# Patient Record
Sex: Female | Born: 1945 | Race: White | Hispanic: No | Marital: Married | Smoking: Former smoker
Health system: Southern US, Community
[De-identification: ages and names within clinical notes are randomized; demographics above are authoritative.]

## PROBLEM LIST (undated history)

## (undated) DIAGNOSIS — I10 Essential (primary) hypertension: Secondary | ICD-10-CM

## (undated) DIAGNOSIS — G629 Polyneuropathy, unspecified: Secondary | ICD-10-CM

## (undated) DIAGNOSIS — E785 Hyperlipidemia, unspecified: Secondary | ICD-10-CM

## (undated) DIAGNOSIS — M1990 Unspecified osteoarthritis, unspecified site: Secondary | ICD-10-CM

## (undated) DIAGNOSIS — T753XXA Motion sickness, initial encounter: Secondary | ICD-10-CM

## (undated) HISTORY — PX: TOTAL KNEE ARTHROPLASTY: SHX125

## (undated) HISTORY — PX: REPLACEMENT TOTAL KNEE BILATERAL: SUR1225

## (undated) HISTORY — PX: CHOLECYSTECTOMY: SHX55

## (undated) HISTORY — PX: ABDOMINAL HYSTERECTOMY: SHX81

---

## 1988-07-18 HISTORY — PX: BREAST EXCISIONAL BIOPSY: SUR124

## 2015-11-22 ENCOUNTER — Emergency Department (HOSPITAL_COMMUNITY): Payer: Medicare Other

## 2015-11-22 ENCOUNTER — Inpatient Hospital Stay (HOSPITAL_COMMUNITY)
Admission: EM | Admit: 2015-11-22 | Discharge: 2015-11-23 | DRG: 149 | Disposition: A | Discharge status: Home / Self Care | Payer: Medicare Other | Attending: Cardiovascular Disease | Admitting: Cardiovascular Disease

## 2015-11-22 ENCOUNTER — Inpatient Hospital Stay (HOSPITAL_COMMUNITY): Payer: Medicare Other

## 2015-11-22 ENCOUNTER — Encounter (HOSPITAL_COMMUNITY): Payer: Self-pay

## 2015-11-22 ENCOUNTER — Encounter (HOSPITAL_COMMUNITY)
Admission: EM | Disposition: A | Discharge status: Home / Self Care | Payer: Self-pay | Source: Home / Self Care | Attending: Cardiovascular Disease

## 2015-11-22 DIAGNOSIS — Z96653 Presence of artificial knee joint, bilateral: Secondary | ICD-10-CM | POA: Diagnosis present

## 2015-11-22 DIAGNOSIS — Z6839 Body mass index (BMI) 39.0-39.9, adult: Secondary | ICD-10-CM

## 2015-11-22 DIAGNOSIS — I10 Essential (primary) hypertension: Secondary | ICD-10-CM | POA: Diagnosis present

## 2015-11-22 DIAGNOSIS — R42 Dizziness and giddiness: Principal | ICD-10-CM | POA: Diagnosis present

## 2015-11-22 DIAGNOSIS — Z87891 Personal history of nicotine dependence: Secondary | ICD-10-CM

## 2015-11-22 DIAGNOSIS — R9431 Abnormal electrocardiogram [ECG] [EKG]: Secondary | ICD-10-CM | POA: Insufficient documentation

## 2015-11-22 DIAGNOSIS — E669 Obesity, unspecified: Secondary | ICD-10-CM | POA: Diagnosis present

## 2015-11-22 DIAGNOSIS — I2510 Atherosclerotic heart disease of native coronary artery without angina pectoris: Secondary | ICD-10-CM | POA: Diagnosis present

## 2015-11-22 DIAGNOSIS — E785 Hyperlipidemia, unspecified: Secondary | ICD-10-CM | POA: Diagnosis present

## 2015-11-22 HISTORY — DX: Essential (primary) hypertension: I10

## 2015-11-22 HISTORY — DX: Hyperlipidemia, unspecified: E78.5

## 2015-11-22 HISTORY — PX: CARDIAC CATHETERIZATION: SHX172

## 2015-11-22 HISTORY — DX: Polyneuropathy, unspecified: G62.9

## 2015-11-22 LAB — I-STAT CHEM 8, ED
BUN: 24 mg/dL — AB (ref 6–20)
CREATININE: 0.7 mg/dL (ref 0.44–1.00)
Calcium, Ion: 1.19 mmol/L (ref 1.13–1.30)
Chloride: 103 mmol/L (ref 101–111)
GLUCOSE: 124 mg/dL — AB (ref 65–99)
HCT: 47 % — ABNORMAL HIGH (ref 36.0–46.0)
Hemoglobin: 16 g/dL — ABNORMAL HIGH (ref 12.0–15.0)
Potassium: 4.1 mmol/L (ref 3.5–5.1)
SODIUM: 141 mmol/L (ref 135–145)
TCO2: 24 mmol/L (ref 0–100)

## 2015-11-22 LAB — CBC WITH DIFFERENTIAL/PLATELET
BASOS ABS: 0 10*3/uL (ref 0.0–0.1)
Basophils Relative: 0 %
EOS PCT: 5 %
Eosinophils Absolute: 0.3 10*3/uL (ref 0.0–0.7)
HEMATOCRIT: 44.4 % (ref 36.0–46.0)
Hemoglobin: 13.9 g/dL (ref 12.0–15.0)
LYMPHS ABS: 2.2 10*3/uL (ref 0.7–4.0)
LYMPHS PCT: 31 %
MCH: 29 pg (ref 26.0–34.0)
MCHC: 31.3 g/dL (ref 30.0–36.0)
MCV: 92.7 fL (ref 78.0–100.0)
MONO ABS: 0.6 10*3/uL (ref 0.1–1.0)
Monocytes Relative: 9 %
NEUTROS ABS: 4.1 10*3/uL (ref 1.7–7.7)
Neutrophils Relative %: 55 %
Platelets: 174 10*3/uL (ref 150–400)
RBC: 4.79 MIL/uL (ref 3.87–5.11)
RDW: 13.9 % (ref 11.5–15.5)
WBC: 7.3 10*3/uL (ref 4.0–10.5)

## 2015-11-22 LAB — COMPREHENSIVE METABOLIC PANEL
ALBUMIN: 3.7 g/dL (ref 3.5–5.0)
ALK PHOS: 100 U/L (ref 38–126)
ALT: 39 U/L (ref 14–54)
AST: 39 U/L (ref 15–41)
Anion gap: 15 (ref 5–15)
BILIRUBIN TOTAL: 0.6 mg/dL (ref 0.3–1.2)
BUN: 21 mg/dL — ABNORMAL HIGH (ref 6–20)
CALCIUM: 9.8 mg/dL (ref 8.9–10.3)
CO2: 22 mmol/L (ref 22–32)
Chloride: 102 mmol/L (ref 101–111)
Creatinine, Ser: 0.74 mg/dL (ref 0.44–1.00)
GFR calc Af Amer: >60 mL/min (ref >60)
GLUCOSE: 129 mg/dL — AB (ref 65–99)
Potassium: 4.2 mmol/L (ref 3.5–5.1)
Sodium: 139 mmol/L (ref 135–145)
TOTAL PROTEIN: 7.4 g/dL (ref 6.5–8.1)

## 2015-11-22 LAB — I-STAT TROPONIN, ED: Troponin i, poc: 0 ng/mL (ref 0.00–0.08)

## 2015-11-22 LAB — TROPONIN I: Troponin I: <0.03 ng/mL (ref <0.031)

## 2015-11-22 SURGERY — LEFT HEART CATH AND CORONARY ANGIOGRAPHY
Anesthesia: LOCAL

## 2015-11-22 MED ORDER — SODIUM CHLORIDE 0.9% FLUSH
3.0000 mL | Freq: Two times a day (BID) | INTRAVENOUS | Status: DC
  Administered 2015-11-22 – 2015-11-23 (×2): 3 mL via INTRAVENOUS

## 2015-11-22 MED ORDER — VERAPAMIL HCL 2.5 MG/ML IV SOLN
INTRAVENOUS | Status: AC
  Filled 2015-11-22: qty 2

## 2015-11-22 MED ORDER — FENTANYL CITRATE (PF) 100 MCG/2ML IJ SOLN
INTRAMUSCULAR | Status: AC
  Filled 2015-11-22: qty 2

## 2015-11-22 MED ORDER — LIDOCAINE HCL (PF) 1 % IJ SOLN
INTRAMUSCULAR | Status: AC
  Filled 2015-11-22: qty 30

## 2015-11-22 MED ORDER — SODIUM CHLORIDE 0.9% FLUSH: 3.0000 mL | INTRAVENOUS | Status: DC | PRN

## 2015-11-22 MED ORDER — HEPARIN (PORCINE) IN NACL 2-0.9 UNIT/ML-% IJ SOLN
INTRAMUSCULAR | Status: DC | PRN
  Administered 2015-11-22: 1500 mL

## 2015-11-22 MED ORDER — MIDAZOLAM HCL 2 MG/2ML IJ SOLN
INTRAMUSCULAR | Status: AC
  Filled 2015-11-22: qty 2

## 2015-11-22 MED ORDER — ONDANSETRON HCL 4 MG/2ML IJ SOLN: 4.0000 mg | Freq: Four times a day (QID) | INTRAMUSCULAR | Status: DC | PRN

## 2015-11-22 MED ORDER — HYDROCHLOROTHIAZIDE 12.5 MG PO CAPS
12.5000 mg | ORAL_CAPSULE | Freq: Every day | ORAL | Status: DC
Start: 2015-11-23 — End: 2015-11-23
  Administered 2015-11-23: 12.5 mg via ORAL
  Filled 2015-11-22 (×2): qty 1

## 2015-11-22 MED ORDER — SODIUM CHLORIDE 0.9 % IV SOLN
INTRAVENOUS | Status: AC
  Administered 2015-11-22: 14:00:00 via INTRAVENOUS

## 2015-11-22 MED ORDER — ASPIRIN 81 MG PO CHEW: 324.0000 mg | CHEWABLE_TABLET | Freq: Once | ORAL | Status: DC

## 2015-11-22 MED ORDER — LIDOCAINE HCL (PF) 1 % IJ SOLN
INTRAMUSCULAR | Status: DC | PRN
  Administered 2015-11-22: 2 mL

## 2015-11-22 MED ORDER — IOPAMIDOL (ISOVUE-370) INJECTION 76%
INTRAVENOUS | Status: DC | PRN
  Administered 2015-11-22: 155 mL via INTRA_ARTERIAL

## 2015-11-22 MED ORDER — ATENOLOL 25 MG PO TABS
100.0000 mg | ORAL_TABLET | Freq: Every day | ORAL | Status: DC
  Administered 2015-11-22: 100 mg via ORAL
  Filled 2015-11-22: qty 4

## 2015-11-22 MED ORDER — HEPARIN SODIUM (PORCINE) 5000 UNIT/ML IJ SOLN
INTRAMUSCULAR | Status: AC
  Administered 2015-11-22: 5000 [IU]
  Filled 2015-11-22: qty 1

## 2015-11-22 MED ORDER — SODIUM CHLORIDE 0.9 % IV SOLN: 250.0000 mL | INTRAVENOUS | Status: DC | PRN

## 2015-11-22 MED ORDER — LISINOPRIL 40 MG PO TABS
40.0000 mg | ORAL_TABLET | Freq: Every day | ORAL | Status: DC
  Administered 2015-11-23: 40 mg via ORAL
  Filled 2015-11-22: qty 1

## 2015-11-22 MED ORDER — IOPAMIDOL (ISOVUE-370) INJECTION 76%
INTRAVENOUS | Status: AC
  Filled 2015-11-22: qty 125

## 2015-11-22 MED ORDER — HEPARIN BOLUS VIA INFUSION: 4000.0000 [IU] | Freq: Once | INTRAVENOUS | Status: DC

## 2015-11-22 MED ORDER — FENTANYL CITRATE (PF) 100 MCG/2ML IJ SOLN
INTRAMUSCULAR | Status: DC | PRN
  Administered 2015-11-22: 25 ug via INTRAVENOUS

## 2015-11-22 MED ORDER — GABAPENTIN 600 MG PO TABS
300.0000 mg | ORAL_TABLET | Freq: Two times a day (BID) | ORAL | Status: DC
  Administered 2015-11-22 – 2015-11-23 (×3): 300 mg via ORAL
  Filled 2015-11-22 (×3): qty 1

## 2015-11-22 MED ORDER — ATENOLOL 25 MG PO TABS: 50.0000 mg | ORAL_TABLET | Freq: Every day | ORAL | Status: DC

## 2015-11-22 MED ORDER — HEPARIN SODIUM (PORCINE) 1000 UNIT/ML IJ SOLN
INTRAMUSCULAR | Status: AC
  Filled 2015-11-22: qty 1

## 2015-11-22 MED ORDER — ACETAMINOPHEN 325 MG PO TABS: 650.0000 mg | ORAL_TABLET | ORAL | Status: DC | PRN

## 2015-11-22 MED ORDER — NORTRIPTYLINE HCL 25 MG PO CAPS
50.0000 mg | ORAL_CAPSULE | Freq: Every day | ORAL | Status: DC
  Administered 2015-11-22: 50 mg via ORAL
  Filled 2015-11-22: qty 2

## 2015-11-22 MED ORDER — NITROGLYCERIN 1 MG/10 ML FOR IR/CATH LAB
INTRA_ARTERIAL | Status: AC
  Filled 2015-11-22: qty 10

## 2015-11-22 MED ORDER — NORTRIPTYLINE HCL 25 MG PO CAPS
25.0000 mg | ORAL_CAPSULE | Freq: Every day | ORAL | Status: DC
  Filled 2015-11-22: qty 1

## 2015-11-22 MED ORDER — HEPARIN SODIUM (PORCINE) 1000 UNIT/ML IJ SOLN
INTRAMUSCULAR | Status: DC | PRN
  Administered 2015-11-22: 4000 [IU] via INTRAVENOUS

## 2015-11-22 MED ORDER — MIDAZOLAM HCL 2 MG/2ML IJ SOLN
INTRAMUSCULAR | Status: DC | PRN
  Administered 2015-11-22: 2 mg via INTRAVENOUS

## 2015-11-22 SURGICAL SUPPLY — 11 items
CATH INFINITI 5FR ANG PIGTAIL (CATHETERS) ×2 IMPLANT
CATH INFINITI JR4 5F (CATHETERS) ×2 IMPLANT
DEVICE RAD COMP TR BAND LRG (VASCULAR PRODUCTS) ×2 IMPLANT
GLIDESHEATH SLEND SS 6F .021 (SHEATH) ×2 IMPLANT
KIT ENCORE 26 ADVANTAGE (KITS) ×2 IMPLANT
KIT HEART LEFT (KITS) ×2 IMPLANT
PACK CARDIAC CATHETERIZATION (CUSTOM PROCEDURE TRAY) ×2 IMPLANT
SYR MEDRAD MARK V 150ML (SYRINGE) ×2 IMPLANT
TRANSDUCER W/STOPCOCK (MISCELLANEOUS) ×2 IMPLANT
TUBING CIL FLEX 10 FLL-RA (TUBING) ×2 IMPLANT
WIRE SAFE-T 1.5MM-J .035X260CM (WIRE) ×2 IMPLANT

## 2015-11-22 NOTE — ED Provider Notes (Signed)
CSN: [id number]     Arrival date & time 11/22/15  1134 History   First MD Initiated Contact with Patient 11/22/15 1136     Chief Complaint  Patient presents with  . Dizziness  . Code STEMI     (Consider location/radiation/quality/duration/timing/severity/associated sxs/prior Treatment) HPI  70 year old female presents with acute dizziness. EMS activated a code STEMI based on ECG. Patient was sitting in Sunday school and all of a sudden became dizzy. Felt like the room was spinning and she felt like she would fall if she tried to stand up. This lasted about 10 minutes. Since then she has felt "off" and not quite right. However she denies chest pain or short of breath. No headaches, blurry vision, vomiting, or weakness/numbness. ECG from EMS shows inferior ST elevations as well as in V4 through 6. No old ECG. History of hypertension, hyperlipidemia.  No past medical history on file. No past surgical history on file. No family history on file. Social History  Substance Use Topics  . Smoking status: Not on file  . Smokeless tobacco: Not on file  . Alcohol Use: Not on file   OB History    No data available     Review of Systems  Respiratory: Negative for shortness of breath.   Cardiovascular: Negative for chest pain.  Gastrointestinal: Negative for vomiting.  Neurological: Positive for dizziness. Negative for syncope, weakness and numbness.  All other systems reviewed and are negative.     Allergies  Review of patient's allergies indicates not on file.  Home Medications   Prior to Admission medications   Not on File   There were no vitals taken for this visit. Physical Exam  Constitutional: She is oriented to person, place, and time. She appears well-developed and well-nourished.  HENT:  Head: Normocephalic and atraumatic.  Right Ear: External ear normal.  Left Ear: External ear normal.  Nose: Nose normal.  Eyes: EOM are normal. Pupils are equal, round, and reactive to  light. Right eye exhibits no discharge. Left eye exhibits no discharge.  Neck: Neck supple.  Cardiovascular: Normal rate, regular rhythm and normal heart sounds.   Pulses:      Radial pulses are 2+ on the right side, and 2+ on the left side.  Pulmonary/Chest: Effort normal and breath sounds normal.  Abdominal: Soft. There is no tenderness.  Neurological: She is alert and oriented to person, place, and time.  5/5 strength in all 4 extremities  Skin: Skin is warm and dry.  Nursing note and vitals reviewed.   ED Course  Procedures (including critical care time) Labs Review Labs Reviewed  COMPREHENSIVE METABOLIC PANEL - Abnormal; Notable for the following:    Glucose, Bld 129 (*)    BUN 21 (*)    All other components within normal limits  I-STAT CHEM 8, ED - Abnormal; Notable for the following:    BUN 24 (*)    Glucose, Bld 124 (*)    Hemoglobin 16.0 (*)    HCT 47.0 (*)    All other components within normal limits  CBC WITH DIFFERENTIAL/PLATELET  TROPONIN I  I-STAT TROPOININ, ED    Imaging Review Dg Chest Portable 1 View  11/22/2015  CLINICAL DATA:  Acute ST-elevation and dizziness today. EXAM: PORTABLE CHEST 1 VIEW COMPARISON:  None. FINDINGS: The cardiomediastinal silhouette is unremarkable. Elevation the right hemidiaphragm is likely chronic. There is no evidence of focal airspace disease, pulmonary edema, suspicious pulmonary nodule/mass, pleural effusion, or pneumothorax. No acute bony abnormalities are  identified. IMPRESSION: No evidence of acute cardiopulmonary disease. Right hemidiaphragm elevation -likely chronic. Electronically Signed   By: Harmon PierJeffrey  Hu M.D.   On: 11/22/2015 14:42   I have personally reviewed and evaluated these images and lab results as part of my medical decision-making.   EKG Interpretation   Date/Time:  Sunday Nov 22 2015 11:39:59 EDT Ventricular Rate:  61 PR Interval:  146 QRS Duration: 86 QT Interval:  414 QTC Calculation: 416 R Axis:    79 Text Interpretation:  Normal sinus rhythm ST elevation, early repol vs  ischemia No old tracing to compare Confirmed by Jamaria Amborn MD, Greysin Medlen [phone])  on 11/22/2015 11:48:27 AM      MDM   Final diagnoses:  Abnormal EKG  Dizziness    Patient appears well here and has no acute chest pain and has not had chest pain at all today. No dyspnea either. Dizziness was probably not cardiac but her ECG does show ST elevation. Given her age and risk factors, cardiology has decided to take to cath lab. Delay in cath lab due to current patients getting cath and no available spot. She was given aspirin by EMS and will be given a heparin bolus. Neuro exam is unremarkable now on a thinks that acute stroke is unlikely. Admit to cardiology.    Pricilla LovelessScott Amiliah Campisi, MD 11/22/15 [phone]

## 2015-11-22 NOTE — H&P (Signed)
     Patient ID: Barbara Hoover MRN: [id number] DOB/AGE: 68/02/1946 68 y.o. Admit date: 11/22/2015  Primary Cardiologist: New  HPI: 70 yo female with history of HTN, HLD, obesity who [facility] with c/o dizziness. EMS noted ST elevation in the inferior leads and activated a Code STEMI. Pt had resolution of dizziness but persistent ST elevation in the inferior and anterolateral leads. No chest pain or dyspnea. No prior cardiac disease. She is a non-smoker.    Review of systems complete and found to be negative unless listed above   Past Medical History  Diagnosis Date  . Hypertension   . Neuropathy (HCC)   . Hyperlipidemia     Family History  Problem Relation Age of Onset  . CAD Mother     Social History   Social History  . Marital Status: N/A    Spouse Name: N/A  . Number of Children: N/A  . Years of Education: N/A   Occupational History  . Not on file.   Social History Main Topics  . Smoking status: Former Smoker -- 1.00 packs/day for 4 years    Types: Cigarettes    Quit date: 07/18/1985  . Smokeless tobacco: Former NeurosurgeonUser    Quit date: 07/24/1968  . Alcohol Use: 0.0 oz/week    0 Standard drinks or equivalent per week     Comment: Occasional  . Drug Use: Not on file  . Sexual Activity: Not on file   Other Topics Concern  . Not on file   Social History Narrative  . No narrative on file    Past Surgical History  Procedure Laterality Date  . Total knee arthroplasty Bilateral   . Cholecystectomy      Allergies  Allergen Reactions  . Sulfa Antibiotics Itching    Prior to Admission Meds:  Prior to Admission medications   Not on File    Physical Exam: Blood pressure 175/74, pulse 62, resp. rate 14, SpO2 97 %.    General: Well developed, well nourished, NAD  HEENT: OP clear, mucus membranes moist  SKIN: warm, dry. No rashes.  Neuro: No focal deficits  Musculoskeletal: Muscle strength 5/5 all ext  Psychiatric: Mood and affect normal  Neck:  No JVD, no carotid bruits, no thyromegaly, no lymphadenopathy.  Lungs:Clear bilaterally, no wheezes, rhonci, crackles  Cardiovascular: Regular rate and rhythm. No murmurs, gallops or rubs.  Abdomen:Soft. Bowel sounds present. Non-tender.  Extremities: No lower extremity edema. Pulses are 2 + in the bilateral DP/PT.   Labs:   Lab Results  Component Value Date   HGB 16.0* 11/22/2015   HCT 47.0* 11/22/2015     Recent Labs Lab 11/22/15 1142  NA 141  K 4.1  CL 103  BUN 24*  CREATININE 0.70  GLUCOSE 124*     EKG: sinus, 1 mm ST elevation leads II, III, avF, V5, V6.   ASSESSMENT AND PLAN:   1. ST elevation: Pt presented with dizziness and inferior ST elevation. Resolution of symptoms but persistent ST elevation. Plan emergent cardiac cath.   2. HTN: Will resume home BP meds.   Earney Hamburghris McAlhany, MD 11/22/2015, 11:53 AM

## 2015-11-22 NOTE — ED Notes (Signed)
[facility]- pt was in Sunday school and became dizzy. Pt reports dizziness has subsided but still does not feel well. Pt denies chest pain at any point.  Pt is alert and oriented, 324mg  of aspirin, 2 nitro, and 4mg  of zofran prior to arrival.

## 2015-11-23 ENCOUNTER — Inpatient Hospital Stay (HOSPITAL_COMMUNITY): Payer: Medicare Other

## 2015-11-23 ENCOUNTER — Encounter (HOSPITAL_COMMUNITY): Payer: Self-pay | Admitting: Cardiovascular Disease

## 2015-11-23 DIAGNOSIS — I10 Essential (primary) hypertension: Secondary | ICD-10-CM | POA: Diagnosis not present

## 2015-11-23 DIAGNOSIS — E785 Hyperlipidemia, unspecified: Secondary | ICD-10-CM | POA: Diagnosis not present

## 2015-11-23 DIAGNOSIS — R9431 Abnormal electrocardiogram [ECG] [EKG]: Secondary | ICD-10-CM | POA: Diagnosis not present

## 2015-11-23 DIAGNOSIS — R42 Dizziness and giddiness: Secondary | ICD-10-CM | POA: Diagnosis not present

## 2015-11-23 HISTORY — DX: Essential (primary) hypertension: I10

## 2015-11-23 LAB — BASIC METABOLIC PANEL
ANION GAP: 10 (ref 5–15)
BUN: 20 mg/dL (ref 6–20)
CHLORIDE: 106 mmol/L (ref 101–111)
CO2: 25 mmol/L (ref 22–32)
Calcium: 9.3 mg/dL (ref 8.9–10.3)
Creatinine, Ser: 0.75 mg/dL (ref 0.44–1.00)
GFR calc Af Amer: >60 mL/min (ref >60)
GLUCOSE: 110 mg/dL — AB (ref 65–99)
POTASSIUM: 3.8 mmol/L (ref 3.5–5.1)
Sodium: 141 mmol/L (ref 135–145)

## 2015-11-23 LAB — CBC
HEMATOCRIT: 43.1 % (ref 36.0–46.0)
HEMOGLOBIN: 13.4 g/dL (ref 12.0–15.0)
MCH: 28.9 pg (ref 26.0–34.0)
MCHC: 31.1 g/dL (ref 30.0–36.0)
MCV: 92.9 fL (ref 78.0–100.0)
PLATELETS: 203 10*3/uL (ref 150–400)
RBC: 4.64 MIL/uL (ref 3.87–5.11)
RDW: 14.1 % (ref 11.5–15.5)
WBC: 8 10*3/uL (ref 4.0–10.5)

## 2015-11-23 LAB — ECHOCARDIOGRAM COMPLETE

## 2015-11-23 MED ORDER — PERFLUTREN LIPID MICROSPHERE
1.0000 mL | INTRAVENOUS | Status: AC | PRN
  Administered 2015-11-23: 2 mL via INTRAVENOUS
  Filled 2015-11-23: qty 10

## 2015-11-23 MED FILL — Nitroglycerin IV Soln 100 MCG/ML in D5W: INTRA_ARTERIAL | Qty: 10 | Status: AC

## 2015-11-23 MED FILL — Verapamil HCl IV Soln 2.5 MG/ML: INTRAVENOUS | Qty: 2 | Status: AC

## 2015-11-23 NOTE — Progress Notes (Signed)
Order received to discharge.  Telemetry removed and CCMD notified.  IV removed with catheter intact.  Discharge education completed with Pt and husband at bedside.  All questions answered.  Copy of EKG given to Pt to take to own cardiologist.   Pt denies chest pain at this time.  Pt stable to discharge.

## 2015-11-23 NOTE — Progress Notes (Signed)
Echocardiogram 2D Echocardiogram has been performed.  Barbara Hoover 11/23/2015, 11:39 AM

## 2015-11-23 NOTE — Progress Notes (Signed)
2D echo shows normal LVF with no valvular disease.  Discussed case with Neuro regarding her acute episode of vertigo. She had no visual symptoms during the event and no other neurological issues at the time.  Neuro recommend no further workup at this time unless she has further vertigo.  She is stable for discharge home.

## 2015-11-23 NOTE — Discharge Summary (Signed)
Discharge Summary    Patient ID: Barbara Hoover,  MRN: [id number], DOB/AGE: 68/02/1946 68 y.o.  Admit date: 11/22/2015 Discharge date: 11/23/2015  Primary Care Provider: Lorna DibbleBarbara L Hoover Primary Cardiologist: Dr. Clifton Hoover  Discharge Diagnoses    Active Problems:   Abnormal EKG   Dizziness   Benign essential HTN   Hyperlipidemia   Allergies Allergies  Allergen Reactions  . Sulfa Antibiotics Itching    Diagnostic Studies/Procedures  Left Heart Cath and Coronary Angiography 11/22/15  Mid RCA lesion, 20% stenosed.  Prox Cx to Mid Cx lesion, 10% stenosed.  The left ventricular systolic function is normal.  1. Mild non-obstructive CAD 2. Normal LV systolic function   Echo 11/23/15 - Procedure narrative: Transthoracic echocardiography. Image  quality was fair. The study was technically difficult, as a  result of poor acoustic windows and body habitus. Intravenous  contrast (Definity) was administered. - Left ventricle: The cavity size was normal. There was mild  concentric hypertrophy. Systolic function was normal. The  estimated ejection fraction was in the range of 60% to 65%. Wall  motion was normal; there were no regional wall motion  abnormalities. Doppler parameters are consistent with abnormal  left ventricular relaxation (grade 1 diastolic dysfunction). The  E/e&' ratio is >15, suggesting elevated LV filling pressure. - Left atrium: The atrium was normal in size. - Inferior vena cava: The vessel was normal in size. The  respirophasic diameter changes were in the normal range (>= 50%),  consistent with normal central venous pressure.  Impressions:  - Technically difficult study. Definity contrast given. LVEF  60-65%, mild LVH, normal wall motion, diastolic dysfunction,  elevated LV filling pressure, normal LA size.  _____________   History of Present Illness   Ms. Barbara Hoover is a 70 year old female with a past medical history of hyperlipidemia  and hypertension.   She presented to the emergency room on 11/22/2015 with complaints of dizziness while at church. She felt dizzy and like the room was spinning and could not stand up. EMS was called. EKG from EMS showed inferior ST elevation as well as ST elevation in V4 through V6. As a result code STEMI was called.  Hospital Course  Patient had no chest pain at all. No shortness of breath. She was taken urgently to the cath lab given her age and risk factors and the presence of ST elevation on EKG.  Cath report above. Patient had no obstructive coronary artery disease. Her troponin was negative 3. Her echocardiogram showed normal EF of 60-65%, mild LVH, and normal wall motion.  Case was discussed with neurology concerning whether the patient should get MRI to rule out CVA or TIA. Patient had no neurological symptoms, neuro felt no further workup was needed at this time. Symptoms felt to be related to vertigo.  Her right radial site is stable, with no hematoma. She will continue her ACE inhibitor, moderate dose statin, HCTZ, and beta blocker. Her heart rate and blood pressure were stable and within normal limits. She was seen by Barbara Hoover and deemed suitable for discharge.  _____________  Discharge Vitals Blood pressure 124/50, pulse 55, temperature 97.8 F (36.6 C), temperature source Oral, resp. rate 16, SpO2 96 %.  There were no vitals filed for this visit.  Labs & Radiologic Studies     CBC  Recent Labs  11/22/15 1136 11/22/15 1142 11/23/15 0321  WBC 7.3  --  8.0  NEUTROABS 4.1  --   --   HGB 13.9 16.0*  13.4  HCT 44.4 47.0* 43.1  MCV 92.7  --  92.9  PLT 174  --  203   Basic Metabolic Panel  Recent Labs  11/22/15 1136 11/22/15 1142 11/23/15 0321  NA 139 141 141  K 4.2 4.1 3.8  CL 102 103 106  CO2 22  --  25  GLUCOSE 129* 124* 110*  BUN 21* 24* 20  CREATININE 0.74 0.70 0.75  CALCIUM 9.8  --  9.3   Liver Function Tests  Recent Labs  11/22/15 1136  AST  39  ALT 39  ALKPHOS 100  BILITOT 0.6  PROT 7.4  ALBUMIN 3.7   Cardiac Enzymes  Recent Labs  11/22/15 1136  TROPONINI <0.03    Dg Chest Portable 1 View  11/22/2015  CLINICAL DATA:  Acute ST-elevation and dizziness today. EXAM: PORTABLE CHEST 1 VIEW COMPARISON:  None. FINDINGS: The cardiomediastinal silhouette is unremarkable. Elevation the right hemidiaphragm is likely chronic. There is no evidence of focal airspace disease, pulmonary edema, suspicious pulmonary nodule/mass, pleural effusion, or pneumothorax. No acute bony abnormalities are identified. IMPRESSION: No evidence of acute cardiopulmonary disease. Right hemidiaphragm elevation -likely chronic. Electronically Signed   By: Barbara Hoover M.D.   On: 11/22/2015 14:42    Disposition   Pt is being discharged home today in good condition.  Follow-up Plans & Appointments    Follow-up Information    Follow up with Barbara Montana, PA-C On 12/21/2015.   Specialties:  Cardiology, Radiology   Why:  at 10:30 am for follow up    Contact information:   [address] [place] [address] [phone]        Discharge Medications   Current Discharge Medication List    CONTINUE these medications which have NOT CHANGED   Details  acetaminophen (TYLENOL) 650 MG CR tablet Take 1,300 mg by mouth daily as needed for pain.    allopurinol (ZYLOPRIM) 100 MG tablet Take 200 mg by mouth daily.    Alpha-Lipoic Acid 200 MG CAPS Take 200 mg by mouth 2 (two) times daily.    amoxicillin (AMOXIL) 500 MG capsule Take 2,000 mg by mouth See admin instructions. Take 4 capsules (2000 mg) by mouth one hour prior to dental appointment    atenolol (TENORMIN) 100 MG tablet Take 100 mg by mouth at bedtime.    gabapentin (NEURONTIN) 300 MG capsule Take 600-900 mg by mouth 3 (three) times daily. Take 2 capsules (600 mg) by mouth every morning and midday, take 3 capsules (900 mg) at bedtime    hydrochlorothiazide (HYDRODIURIL) 12.5  MG tablet Take 12.5 mg by mouth daily.    lisinopril (PRINIVIL,ZESTRIL) 20 MG tablet Take 40 mg by mouth daily.    Multiple Vitamin (MULTIVITAMIN WITH MINERALS) TABS tablet Take 1 tablet by mouth daily.    naproxen sodium (ALEVE) 220 MG tablet Take 440 mg by mouth daily as needed (pain).    nortriptyline (PAMELOR) 25 MG capsule Take 50 mg by mouth at bedtime.    pravastatin (PRAVACHOL) 40 MG tablet Take 40 mg by mouth at bedtime.           Outstanding Labs/Studies    Duration of Discharge Encounter   Greater than 30 minutes including physician time.  Signed, Little Ishikawa NP 11/23/2015, 1:06 PM

## 2015-11-23 NOTE — Progress Notes (Signed)
SUBJECTIVE:  No further dizziness.  No CP  OBJECTIVE:   Vitals:   Filed Vitals:   11/22/15 1600 11/22/15 2000 11/22/15 2246 11/23/15 0538  BP: 136/50 139/61  124/50  Pulse: 61 61 69 55  Temp:  98.1 F (36.7 C)  97.8 F (36.6 C)  TempSrc:  Oral  Oral  Resp: 20   16  SpO2: 95% 98%  96%   I&O's:  No intake or output data in the 24 hours ending 11/23/15 0754 TELEMETRY: Reviewed telemetry pt in NSR:     PHYSICAL EXAM General: Well developed, well nourished, in no acute distress Head: Eyes PERRLA, No xanthomas.   Normal cephalic and atramatic  Lungs:   Clear bilaterally to auscultation and percussion. Heart:   HRRR S1 S2 Pulses are 2+ & equal. Abdomen: Bowel sounds are positive, abdomen soft and non-tender without masses  Extremities:   No clubbing, cyanosis or edema.  DP +1 Neuro: Alert and oriented X 3. Psych:  Good affect, responds appropriately   LABS: Basic Metabolic Panel:  Recent Labs  16/10/96 1136 11/22/15 1142 11/23/15 0321  NA 139 141 141  K 4.2 4.1 3.8  CL 102 103 106  CO2 22  --  25  GLUCOSE 129* 124* 110*  BUN 21* 24* 20  CREATININE 0.74 0.70 0.75  CALCIUM 9.8  --  9.3   Liver Function Tests:  Recent Labs  11/22/15 1136  AST 39  ALT 39  ALKPHOS 100  BILITOT 0.6  PROT 7.4  ALBUMIN 3.7   No results for input(s): LIPASE, AMYLASE in the last 72 hours. CBC:  Recent Labs  11/22/15 1136 11/22/15 1142 11/23/15 0321  WBC 7.3  --  8.0  NEUTROABS 4.1  --   --   HGB 13.9 16.0* 13.4  HCT 44.4 47.0* 43.1  MCV 92.7  --  92.9  PLT 174  --  203   Cardiac Enzymes:  Recent Labs  11/22/15 1136  TROPONINI <0.03   BNP: Invalid input(s): POCBNP D-Dimer: No results for input(s): DDIMER in the last 72 hours. Hemoglobin A1C: No results for input(s): HGBA1C in the last 72 hours. Fasting Lipid Panel: No results for input(s): CHOL, HDL, LDLCALC, TRIG, CHOLHDL, LDLDIRECT in the last 72 hours. Thyroid Function Tests: No results for input(s):  TSH, T4TOTAL, T3FREE, THYROIDAB in the last 72 hours.  Invalid input(s): FREET3 Anemia Panel: No results for input(s): VITAMINB12, FOLATE, FERRITIN, TIBC, IRON, RETICCTPCT in the last 72 hours. Coag Panel:   No results found for: INR, PROTIME  RADIOLOGY: Dg Chest Portable 1 View  11/22/2015  CLINICAL DATA:  Acute ST-elevation and dizziness today. EXAM: PORTABLE CHEST 1 VIEW COMPARISON:  None. FINDINGS: The cardiomediastinal silhouette is unremarkable. Elevation the right hemidiaphragm is likely chronic. There is no evidence of focal airspace disease, pulmonary edema, suspicious pulmonary nodule/mass, pleural effusion, or pneumothorax. No acute bony abnormalities are identified. IMPRESSION: No evidence of acute cardiopulmonary disease. Right hemidiaphragm elevation -likely chronic. Electronically Signed   By: Harmon Pier M.D.   On: 11/22/2015 14:42    ASSESSMENT AND PLAN:   1. ST elevation: Pt presented with dizziness and inferior ST elevation. Resolution of symptoms but persistent ST elevation. Cath showed 20% RCA and 10% LCX and normal LVF.  Trop neg x 2.  2D echo pending this am.  If normal then ok to d/c home today.  2. HTN: Stable on BB/diuretic and ACE I.  3. Hyperlipidemia - continue statin  4.  Dizziness that is  consistent with acute vertigo.  There was no trigger and she was just sitting in church.  Lasted about 10 minutes but felt poorly after it was over.  Will discuss with Neuro whether we should get an MRI.   Quintella ReichertURNER,Uliana Brinker R, MD  11/23/2015  7:54 AM

## 2015-12-21 ENCOUNTER — Ambulatory Visit: Payer: Medicare Other | Admitting: Physician Assistant

## 2017-05-10 IMAGING — CR DG CHEST 1V PORT
1 series · 1 of 1 positions shown · non-contrast
Comparison: None.

CLINICAL DATA: Acute ST-elevation and dizziness today.

EXAM:
PORTABLE CHEST 1 VIEW

[AP]
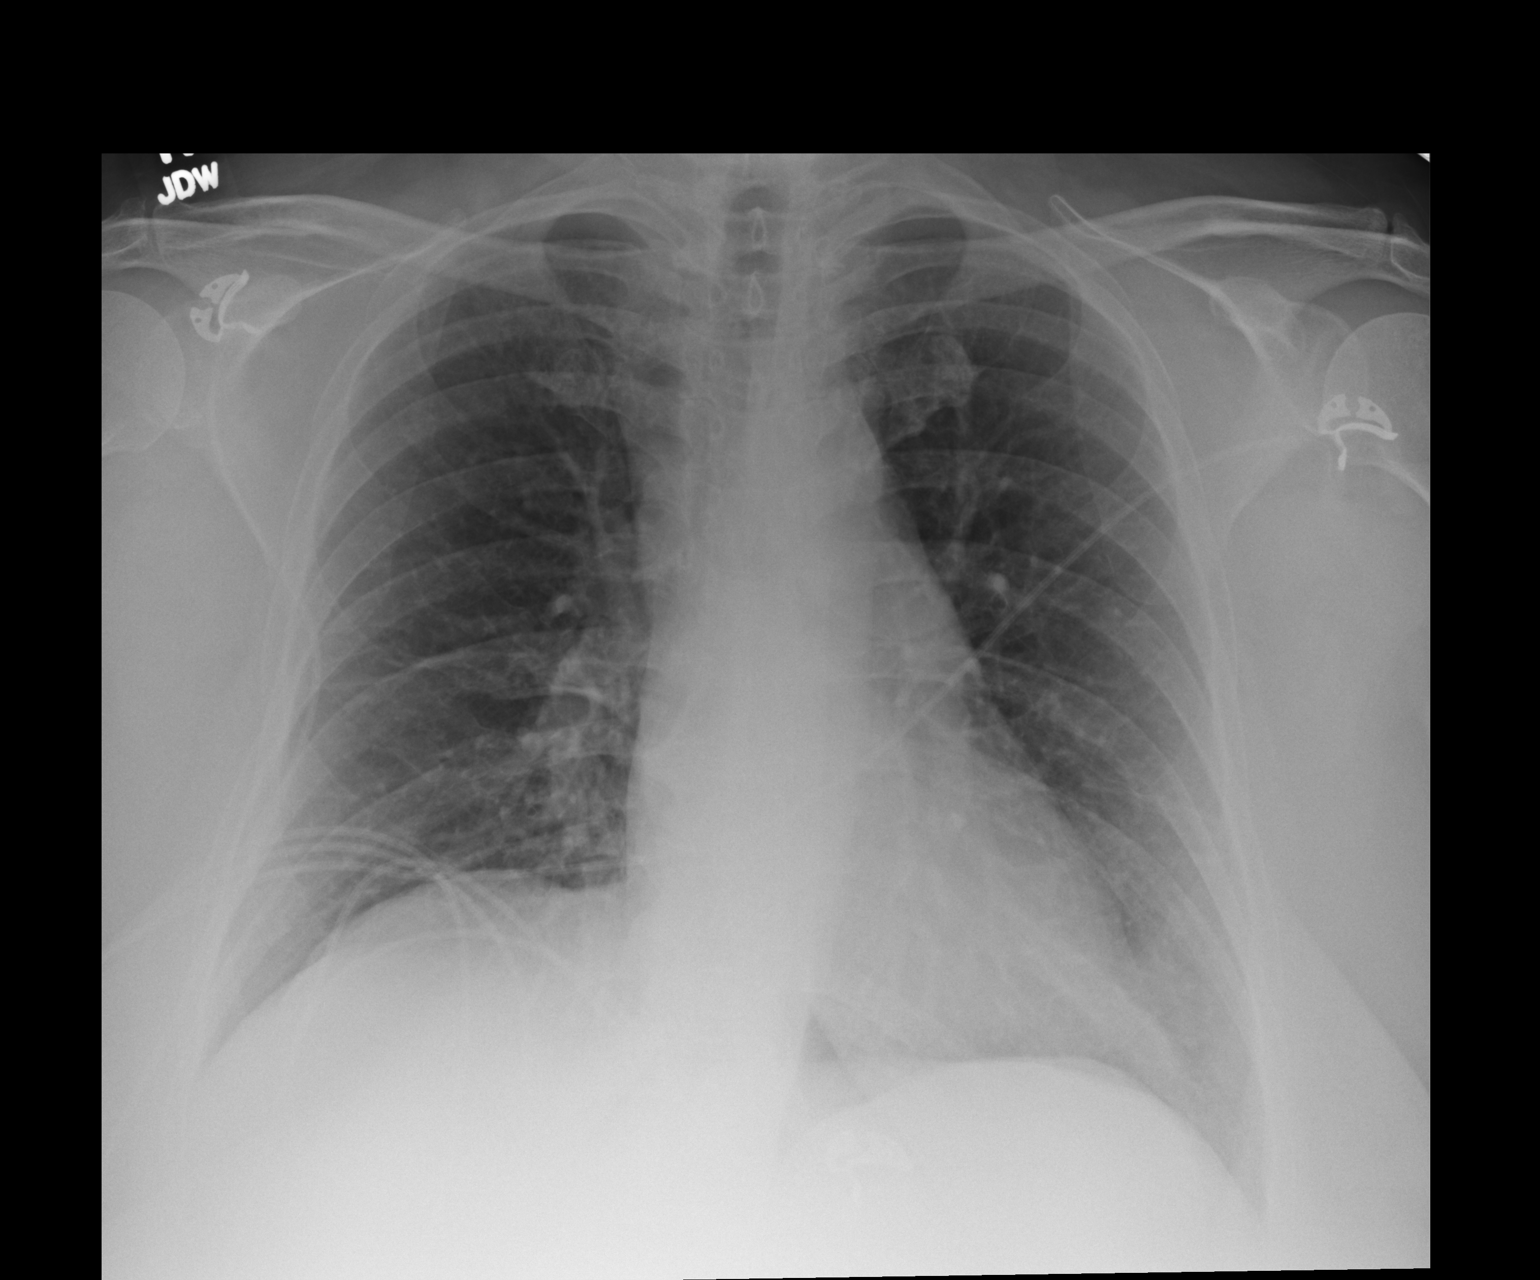

[1 of 1 positions shown; findings below may reference images not displayed]

FINDINGS: The cardiomediastinal silhouette is unremarkable.

Elevation the right hemidiaphragm is likely chronic.

There is no evidence of focal airspace disease, pulmonary edema,
suspicious pulmonary nodule/mass, pleural effusion, or pneumothorax.

No acute bony abnormalities are identified.
IMPRESSION: No evidence of acute cardiopulmonary disease.

Right hemidiaphragm elevation -likely chronic.

## 2017-07-22 ENCOUNTER — Emergency Department
Admission: EM | Admit: 2017-07-22 | Discharge: 2017-07-23 | Disposition: A | Discharge status: Home / Self Care | Payer: Medicare Other | Attending: Emergency Medicine | Admitting: Emergency Medicine

## 2017-07-22 ENCOUNTER — Other Ambulatory Visit: Payer: Self-pay

## 2017-07-22 ENCOUNTER — Encounter: Payer: Self-pay | Admitting: Emergency Medicine

## 2017-07-22 DIAGNOSIS — Z87891 Personal history of nicotine dependence: Secondary | ICD-10-CM | POA: Insufficient documentation

## 2017-07-22 DIAGNOSIS — I10 Essential (primary) hypertension: Secondary | ICD-10-CM | POA: Insufficient documentation

## 2017-07-22 DIAGNOSIS — Z96653 Presence of artificial knee joint, bilateral: Secondary | ICD-10-CM | POA: Diagnosis not present

## 2017-07-22 DIAGNOSIS — Z9049 Acquired absence of other specified parts of digestive tract: Secondary | ICD-10-CM | POA: Diagnosis not present

## 2017-07-22 DIAGNOSIS — K529 Noninfective gastroenteritis and colitis, unspecified: Secondary | ICD-10-CM | POA: Diagnosis not present

## 2017-07-22 DIAGNOSIS — Z79899 Other long term (current) drug therapy: Secondary | ICD-10-CM | POA: Diagnosis not present

## 2017-07-22 DIAGNOSIS — R112 Nausea with vomiting, unspecified: Secondary | ICD-10-CM | POA: Diagnosis present

## 2017-07-22 LAB — COMPREHENSIVE METABOLIC PANEL
ALBUMIN: 4.2 g/dL (ref 3.5–5.0)
ALK PHOS: 92 U/L (ref 38–126)
ALT: 36 U/L (ref 14–54)
AST: 45 U/L — AB (ref 15–41)
Anion gap: 15 (ref 5–15)
BUN: 21 mg/dL — AB (ref 6–20)
CALCIUM: 9.5 mg/dL (ref 8.9–10.3)
CO2: 22 mmol/L (ref 22–32)
Chloride: 103 mmol/L (ref 101–111)
Creatinine, Ser: 0.76 mg/dL (ref 0.44–1.00)
GFR calc Af Amer: >60 mL/min (ref >60)
GFR calc non Af Amer: >60 mL/min (ref >60)
GLUCOSE: 171 mg/dL — AB (ref 65–99)
Potassium: 3.1 mmol/L — ABNORMAL LOW (ref 3.5–5.1)
Sodium: 140 mmol/L (ref 135–145)
Total Bilirubin: 0.4 mg/dL (ref 0.3–1.2)
Total Protein: 7.7 g/dL (ref 6.5–8.1)

## 2017-07-22 LAB — CBC
HCT: 45.5 % (ref 35.0–47.0)
Hemoglobin: 15.1 g/dL (ref 12.0–16.0)
MCH: 30.4 pg (ref 26.0–34.0)
MCHC: 33.3 g/dL (ref 32.0–36.0)
MCV: 91.5 fL (ref 80.0–100.0)
Platelets: 200 10*3/uL (ref 150–440)
RBC: 4.97 MIL/uL (ref 3.80–5.20)
RDW: 13.7 % (ref 11.5–14.5)
WBC: 8.4 10*3/uL (ref 3.6–11.0)

## 2017-07-22 LAB — LIPASE, BLOOD: Lipase: 39 U/L (ref 11–51)

## 2017-07-22 MED ORDER — ONDANSETRON HCL 4 MG/2ML IJ SOLN
INTRAMUSCULAR | Status: AC
  Administered 2017-07-22: 8 mg
  Filled 2017-07-22: qty 4

## 2017-07-22 MED ORDER — SODIUM CHLORIDE 0.9 % IV SOLN
1000.0000 mL | Freq: Once | INTRAVENOUS | Status: AC
Start: 2017-07-22 — End: 2017-07-23
  Administered 2017-07-22: 1000 mL via INTRAVENOUS

## 2017-07-22 MED ORDER — SODIUM CHLORIDE 0.9 % IV SOLN
8.0000 mg | Freq: Once | INTRAVENOUS | Status: DC
  Filled 2017-07-22: qty 4

## 2017-07-22 NOTE — ED Notes (Signed)
Unsuccessful 22g IV start (RH) unsuccessful x1, purple top obtained.

## 2017-07-22 NOTE — ED Provider Notes (Signed)
[facility] Emergency Department Provider Note   ____________________________________________    I have reviewed the triage vital signs and the nursing notes.   HISTORY  Chief Complaint Nausea; Emesis; Diarrhea; and Dizziness     HPI Barbara Hoover is a 72 y.o. female who presents with nausea vomiting and dizziness.  Patient reports she had a mild upset stomach earlier this evening but then abruptly became nauseated and had multiple episodes of vomiting.  She had one episode of loose stool as well her primary complaint is nausea and vomiting.  She denies abdominal pain.  She is also had dizziness which she describes as room spinning.  No history of vertigo in the past.  No fevers or chills.  No sick contact.  No recent travels.  Has not taken anything for this   Past Medical History:  Diagnosis Date  . Benign essential HTN 11/23/2015  . Hyperlipidemia   . Hypertension   . Neuropathy     Patient Active Problem List   Diagnosis Date Noted  . Benign essential HTN 11/23/2015  . Hyperlipidemia 11/23/2015  . Abnormal EKG   . Dizziness     Past Surgical History:  Procedure Laterality Date  . ABDOMINAL HYSTERECTOMY    . CARDIAC CATHETERIZATION N/A 11/22/2015   Procedure: Left Heart Cath and Coronary Angiography;  Surgeon: Kathleene Hazel, MD;  Location: [facility] INVASIVE CV LAB;  Service: Cardiovascular;  Laterality: N/A;  . CHOLECYSTECTOMY    . REPLACEMENT TOTAL KNEE BILATERAL    . TOTAL KNEE ARTHROPLASTY Bilateral     Prior to Admission medications   Medication Sig Start Date End Date Taking? Authorizing Provider  acetaminophen (TYLENOL) 650 MG CR tablet Take 1,300 mg by mouth daily as needed for pain.    [provider]  allopurinol (ZYLOPRIM) 100 MG tablet Take 200 mg by mouth daily.    [provider]  Alpha-Lipoic Acid 200 MG CAPS Take 200 mg by mouth 2 (two) times daily.    [provider]  amoxicillin (AMOXIL) 500  MG capsule Take 2,000 mg by mouth See admin instructions. Take 4 capsules (2000 mg) by mouth one hour prior to dental appointment    [provider]  atenolol (TENORMIN) 100 MG tablet Take 100 mg by mouth at bedtime.    [provider]  gabapentin (NEURONTIN) 300 MG capsule Take 600-900 mg by mouth 3 (three) times daily. Take 2 capsules (600 mg) by mouth every morning and midday, take 3 capsules (900 mg) at bedtime    [provider]  hydrochlorothiazide (HYDRODIURIL) 12.5 MG tablet Take 12.5 mg by mouth daily.    [provider]  lisinopril (PRINIVIL,ZESTRIL) 20 MG tablet Take 40 mg by mouth daily.    [provider]  Multiple Vitamin (MULTIVITAMIN WITH MINERALS) TABS tablet Take 1 tablet by mouth daily.    [provider]  naproxen sodium (ALEVE) 220 MG tablet Take 440 mg by mouth daily as needed (pain).    [provider]  nortriptyline (PAMELOR) 25 MG capsule Take 50 mg by mouth at bedtime.    [provider]  pravastatin (PRAVACHOL) 40 MG tablet Take 40 mg by mouth at bedtime.    [provider]     Allergies Sulfa antibiotics  Family History  Problem Relation Age of Onset  . CAD Mother     Social History Social History   Tobacco Use  . Smoking status: Former Smoker    Packs/day: 1.00  Years: 4.00    Pack years: 4.00    Types: Cigarettes    Last attempt to quit: 07/18/1985    Years since quitting: 32.0  . Smokeless tobacco: Former Neurosurgeon    Quit date: 07/24/1968  Substance Use Topics  . Alcohol use: Yes    Alcohol/week: 0.0 oz    Comment: Occasional  . Drug use: No    Review of Systems  Constitutional: Dizziness as above Eyes: No visual changes.  ENT: No sore throat. Cardiovascular: Denies chest pain. Respiratory: Denies shortness of breath. Gastrointestinal: as above Genitourinary: Negative for dysuria. Musculoskeletal: Negative for back pain. Skin: Negative for rash. Neurological:  Negative for headaches    ____________________________________________   PHYSICAL EXAM:  VITAL SIGNS: ED Triage Vitals  Enc Vitals Group     BP 07/22/17 2120 (!) 151/59     Pulse Rate 07/22/17 2120 71     Resp 07/22/17 2120 (!) 23     Temp 07/22/17 2120 (!) 97.5 F (36.4 C)     Temp Source 07/22/17 2120 Oral     SpO2 07/22/17 2120 96 %     Weight 07/22/17 2122 106.6 kg (235 lb)     Height 07/22/17 2122 1.626 m (5\' 4" )     Head Circumference --      Peak Flow --      Pain Score --      Pain Loc --      Pain Edu? --      Excl. in GC? --     Constitutional: Alert and oriented.  Actively retching Eyes: Conjunctivae are normal.  No nystagmus  Nose: No congestion/rhinnorhea. Mouth/Throat: Mucous membranes are moist.    Cardiovascular: Normal rate, regular rhythm. Peri Jefferson peripheral circulation. Respiratory: Normal respiratory effort.  No retractions Gastrointestinal: Soft and nontender. No distention.  No CVA tenderness. Genitourinary: deferred Musculoskeletal: No lower extremity tenderness nor edema.  Warm and well perfused Neurologic:  Normal speech and language. No gross focal neurologic deficits are appreciated.  Skin:  Skin is warm, dry and intact. No rash noted. Psychiatric: Mood and affect are normal. Speech and behavior are normal.  ____________________________________________   LABS (all labs ordered are listed, but only abnormal results are displayed)  Labs Reviewed  COMPREHENSIVE METABOLIC PANEL - Abnormal; Notable for the following components:      Result Value   Potassium 3.1 (*)    Glucose, Bld 171 (*)    BUN 21 (*)    AST 45 (*)    All other components within normal limits  LIPASE, BLOOD  CBC  URINALYSIS, COMPLETE (UACMP) WITH MICROSCOPIC   ____________________________________________  EKG  ED ECG REPORT I, Jene Every, the attending physician, personally viewed and interpreted this ECG.  Date: 07/22/2017  Rate: 71 Rhythm: normal sinus  rhythm QRS Axis: normal Intervals: normal ST/T Wave abnormalities: normal   ____________________________________________  RADIOLOGY  None ____________________________________________   PROCEDURES  Procedure(s) performed: No  Procedures   Critical Care performed: No ____________________________________________   INITIAL IMPRESSION / ASSESSMENT AND PLAN / ED COURSE  Pertinent labs & imaging results that were available during my care of the patient were reviewed by me and considered in my medical decision making (see chart for details).  Patient presents with primary complaint of nausea vomiting and dizziness, one episode of diarrhea.  HPI appears most consistent with gastritis/likely viral.  Vertigo is also a possibility.  Will treat with IV fluids, IV Zofran and reevaluate  ----------------------------------------- 11:37 PM on 07/22/2017 -----------------------------------------  Patient reports her nausea has improved somewhat but she still does not feel well enough to go home.  Will give more fluids more nausea medication and reevaluate    ____________________________________________   FINAL CLINICAL IMPRESSION(S) / ED DIAGNOSES  Final diagnoses:  Gastroenteritis        Note:  This document was prepared using Dragon voice recognition software and may include unintentional dictation errors.    Jene EveryKinner, Kalecia Hartney, MD 07/22/17 2337

## 2017-07-22 NOTE — ED Triage Notes (Signed)
Pt to ED via EMS from home c/o diarrhea that started around 4pm x5 and nausea, vomiting x5, and dizziness that started 1hr PTA.  Pt presents clammy, states has had a flu shot this year.  EMS vitals 127/84 BP, 96% RA, 73 HR,  A&Ox4.

## 2017-07-23 DIAGNOSIS — K529 Noninfective gastroenteritis and colitis, unspecified: Secondary | ICD-10-CM | POA: Diagnosis not present

## 2017-07-23 MED ORDER — ONDANSETRON 4 MG PO TBDP: 4.0000 mg | ORAL_TABLET | Freq: Three times a day (TID) | ORAL | 0 refills | Status: AC | PRN

## 2017-07-23 NOTE — Discharge Instructions (Signed)
1.  You may take Zofran as needed for nausea/vomiting. 2.  Clear liquids x12 hours, then bland diet x3 days, then slowly advance diet as tolerated. 3.  Return to the ER for worsening symptoms, persistent vomiting, difficulty breathing or other concerns. 

## 2017-07-23 NOTE — ED Notes (Signed)
Up to bathroom with assist.  Patient reports feeling some better at this time.

## 2017-07-23 NOTE — ED Provider Notes (Signed)
-----------------------------------------   1:15 AM on 07/23/2017 -----------------------------------------  Care assumed from Dr. Cyril LoosenKinner.  Patient was able to ambulate to the commode without emesis or dizziness.  Overall states she is feeling much better.  Second liter IV fluids infusing.  Anticipate discharge home once IV fluids are completed.   ----------------------------------------- 2:43 AM on 07/23/2017 -----------------------------------------  IV fluids completed.  Patient feeling much better and is eager for discharge home.  Will discharge home with ODT Zofran and she will follow-up with her PCP this week.  Strict return precautions given.  Patient verbalizes understanding and agrees with plan of care.   Irean HongSung, Ceclia Koker J, MD 07/23/17 [phone]

## 2017-09-06 ENCOUNTER — Other Ambulatory Visit: Payer: Self-pay | Admitting: Student

## 2017-09-06 DIAGNOSIS — M545 Low back pain: Secondary | ICD-10-CM

## 2017-09-14 ENCOUNTER — Ambulatory Visit
Admission: RE | Admit: 2017-09-14 | Discharge: 2017-09-14 | Disposition: A | Discharge status: Home / Self Care | Payer: Medicare Other | Source: Ambulatory Visit | Attending: Student | Admitting: Student

## 2017-09-14 DIAGNOSIS — M47896 Other spondylosis, lumbar region: Secondary | ICD-10-CM | POA: Diagnosis not present

## 2017-09-14 DIAGNOSIS — M48061 Spinal stenosis, lumbar region without neurogenic claudication: Secondary | ICD-10-CM | POA: Insufficient documentation

## 2017-09-14 DIAGNOSIS — M545 Low back pain: Secondary | ICD-10-CM | POA: Diagnosis present

## 2017-09-14 DIAGNOSIS — M5127 Other intervertebral disc displacement, lumbosacral region: Secondary | ICD-10-CM | POA: Insufficient documentation

## 2017-09-14 DIAGNOSIS — M5126 Other intervertebral disc displacement, lumbar region: Secondary | ICD-10-CM | POA: Insufficient documentation

## 2017-09-14 DIAGNOSIS — E882 Lipomatosis, not elsewhere classified: Secondary | ICD-10-CM | POA: Insufficient documentation

## 2018-08-16 NOTE — Discharge Instructions (Signed)

## 2018-08-20 ENCOUNTER — Ambulatory Visit: Payer: Medicare Other | Admitting: Anesthesiology

## 2018-08-20 ENCOUNTER — Ambulatory Visit
Admission: RE | Admit: 2018-08-20 | Discharge: 2018-08-20 | Disposition: A | Discharge status: Home / Self Care | Payer: Medicare Other | Attending: Ophthalmology | Admitting: Ophthalmology

## 2018-08-20 ENCOUNTER — Encounter
Admission: RE | Disposition: A | Discharge status: Home / Self Care | Payer: Self-pay | Source: Home / Self Care | Attending: Ophthalmology

## 2018-08-20 DIAGNOSIS — K219 Gastro-esophageal reflux disease without esophagitis: Secondary | ICD-10-CM | POA: Insufficient documentation

## 2018-08-20 DIAGNOSIS — Z9861 Coronary angioplasty status: Secondary | ICD-10-CM | POA: Insufficient documentation

## 2018-08-20 DIAGNOSIS — Z79899 Other long term (current) drug therapy: Secondary | ICD-10-CM | POA: Diagnosis not present

## 2018-08-20 DIAGNOSIS — E7800 Pure hypercholesterolemia, unspecified: Secondary | ICD-10-CM | POA: Insufficient documentation

## 2018-08-20 DIAGNOSIS — M810 Age-related osteoporosis without current pathological fracture: Secondary | ICD-10-CM | POA: Diagnosis not present

## 2018-08-20 DIAGNOSIS — I10 Essential (primary) hypertension: Secondary | ICD-10-CM | POA: Diagnosis not present

## 2018-08-20 DIAGNOSIS — Z882 Allergy status to sulfonamides status: Secondary | ICD-10-CM | POA: Insufficient documentation

## 2018-08-20 DIAGNOSIS — H2512 Age-related nuclear cataract, left eye: Secondary | ICD-10-CM | POA: Insufficient documentation

## 2018-08-20 DIAGNOSIS — G629 Polyneuropathy, unspecified: Secondary | ICD-10-CM | POA: Diagnosis not present

## 2018-08-20 DIAGNOSIS — M1990 Unspecified osteoarthritis, unspecified site: Secondary | ICD-10-CM | POA: Insufficient documentation

## 2018-08-20 HISTORY — PX: CATARACT EXTRACTION W/PHACO: SHX586

## 2018-08-20 HISTORY — DX: Unspecified osteoarthritis, unspecified site: M19.90

## 2018-08-20 HISTORY — DX: Motion sickness, initial encounter: T75.3XXA

## 2018-08-20 SURGERY — PHACOEMULSIFICATION, CATARACT, WITH IOL INSERTION
Anesthesia: Monitor Anesthesia Care | Site: Eye | Laterality: Left

## 2018-08-20 MED ORDER — TETRACAINE HCL 0.5 % OP SOLN
1.0000 [drp] | OPHTHALMIC | Status: DC | PRN
Start: 2018-08-20 — End: 2018-08-20
  Administered 2018-08-20 (×2): 1 [drp] via OPHTHALMIC

## 2018-08-20 MED ORDER — EPINEPHRINE PF 1 MG/ML IJ SOLN
INTRAOCULAR | Status: DC | PRN
  Administered 2018-08-20: 65 mL via OPHTHALMIC

## 2018-08-20 MED ORDER — SODIUM HYALURONATE 10 MG/ML IO SOLN
INTRAOCULAR | Status: DC | PRN
  Administered 2018-08-20: 0.55 mL via INTRAOCULAR

## 2018-08-20 MED ORDER — MIDAZOLAM HCL 2 MG/2ML IJ SOLN
INTRAMUSCULAR | Status: DC | PRN
  Administered 2018-08-20: 2 mg via INTRAVENOUS

## 2018-08-20 MED ORDER — SODIUM HYALURONATE 23 MG/ML IO SOLN
INTRAOCULAR | Status: DC | PRN
  Administered 2018-08-20: 0.6 mL via INTRAOCULAR

## 2018-08-20 MED ORDER — LIDOCAINE HCL (PF) 2 % IJ SOLN
INTRAOCULAR | Status: DC | PRN
  Administered 2018-08-20: 2 mL via INTRAOCULAR

## 2018-08-20 MED ORDER — MOXIFLOXACIN HCL 0.5 % OP SOLN
OPHTHALMIC | Status: DC | PRN
  Administered 2018-08-20: 0.2 mL via OPHTHALMIC

## 2018-08-20 MED ORDER — ARMC OPHTHALMIC DILATING DROPS
1.0000 "application " | OPHTHALMIC | Status: DC | PRN
  Administered 2018-08-20 (×3): 1 via OPHTHALMIC

## 2018-08-20 MED ORDER — LACTATED RINGERS IV SOLN: INTRAVENOUS | Status: DC

## 2018-08-20 MED ORDER — FENTANYL CITRATE (PF) 100 MCG/2ML IJ SOLN
INTRAMUSCULAR | Status: DC | PRN
  Administered 2018-08-20: 50 ug via INTRAVENOUS

## 2018-08-20 SURGICAL SUPPLY — 19 items
CANNULA ANT/CHMB 27G (MISCELLANEOUS) ×2 IMPLANT
CANNULA ANT/CHMB 27GA (MISCELLANEOUS) ×6 IMPLANT
DISSECTOR HYDRO NUCLEUS 50X22 (MISCELLANEOUS) ×3 IMPLANT
GLOVE SURG LX 7.5 STRW (GLOVE) ×2
GLOVE SURG LX STRL 7.5 STRW (GLOVE) ×1 IMPLANT
GLOVE SURG SYN 8.5  E (GLOVE) ×2
GLOVE SURG SYN 8.5 E (GLOVE) ×1 IMPLANT
GLOVE SURG SYN 8.5 PF PI (GLOVE) ×1 IMPLANT
GOWN STRL REUS W/ TWL LRG LVL3 (GOWN DISPOSABLE) ×2 IMPLANT
GOWN STRL REUS W/TWL LRG LVL3 (GOWN DISPOSABLE) ×4
LENS IOL TECNIS ITEC 18.0 (Intraocular Lens) ×2 IMPLANT
MARKER SKIN DUAL TIP RULER LAB (MISCELLANEOUS) ×3 IMPLANT
PACK DR. KING ARMS (PACKS) ×3 IMPLANT
PACK EYE AFTER SURG (MISCELLANEOUS) ×3 IMPLANT
PACK OPTHALMIC (MISCELLANEOUS) ×3 IMPLANT
SYR 3ML LL SCALE MARK (SYRINGE) ×3 IMPLANT
SYR TB 1ML LUER SLIP (SYRINGE) ×3 IMPLANT
WATER STERILE IRR 500ML POUR (IV SOLUTION) ×3 IMPLANT
WIPE NON LINTING 3.25X3.25 (MISCELLANEOUS) ×3 IMPLANT

## 2018-08-20 NOTE — Anesthesia Postprocedure Evaluation (Signed)
Anesthesia Post Note  Patient: Barbara Hoover  Procedure(s) Performed: CATARACT EXTRACTION PHACO AND INTRAOCULAR LENS PLACEMENT ([facility]) LEFT TOPICAL (Left Eye)  Patient location during evaluation: PACU Anesthesia Type: MAC Level of consciousness: awake and alert Pain management: pain level controlled Vital Signs Assessment: post-procedure vital signs reviewed and stable Respiratory status: spontaneous breathing, nonlabored ventilation, respiratory function stable and patient connected to nasal cannula oxygen Cardiovascular status: stable and blood pressure returned to baseline Postop Assessment: no apparent nausea or vomiting Anesthetic complications: no    Alisa Graff

## 2018-08-20 NOTE — H&P (Signed)

## 2018-08-20 NOTE — Transfer of Care (Signed)
Immediate Anesthesia Transfer of Care Note  Patient: Barbara Hoover  Procedure(s) Performed: CATARACT EXTRACTION PHACO AND INTRAOCULAR LENS PLACEMENT ([facility]) LEFT TOPICAL (Left Eye)  Patient Location: PACU  Anesthesia Type: MAC  Level of Consciousness: awake, alert  and patient cooperative  Airway and Oxygen Therapy: Patient Spontanous Breathing and Patient connected to supplemental oxygen  Post-op Assessment: Post-op Vital signs reviewed, Patient's Cardiovascular Status Stable, Respiratory Function Stable, Patent Airway and No signs of Nausea or vomiting  Post-op Vital Signs: Reviewed and stable  Complications: No apparent anesthesia complications

## 2018-08-20 NOTE — Anesthesia Preprocedure Evaluation (Signed)
Anesthesia Evaluation  Patient identified by MRN, date of birth, ID band Patient awake    Reviewed: Allergy & Precautions, H&P , NPO status , Patient's Chart, lab work & pertinent test results, reviewed documented beta blocker date and time   Airway Mallampati: II  TM Distance: >3 FB Neck ROM: full    Dental no notable dental hx.    Pulmonary former smoker,    Pulmonary exam normal breath sounds clear to auscultation       Cardiovascular Exercise Tolerance: Good hypertension,  Rhythm:regular Rate:Normal     Neuro/Psych negative neurological ROS  negative psych ROS   GI/Hepatic negative GI ROS, Neg liver ROS,   Endo/Other  negative endocrine ROS  Renal/GU negative Renal ROS  negative genitourinary   Musculoskeletal   Abdominal   Peds  Hematology negative hematology ROS (+)   Anesthesia Other Findings   Reproductive/Obstetrics negative OB ROS                             Anesthesia Physical Anesthesia Plan  ASA: II  Anesthesia Plan: MAC   Post-op Pain Management:    Induction:   PONV Risk Score and Plan:   Airway Management Planned:   Additional Equipment:   Intra-op Plan:   Post-operative Plan:   Informed Consent: I have reviewed the patients History and Physical, chart, labs and discussed the procedure including the risks, benefits and alternatives for the proposed anesthesia with the patient or authorized representative who has indicated his/her understanding and acceptance.     Dental Advisory Given  Plan Discussed with: CRNA  Anesthesia Plan Comments:         Anesthesia Quick Evaluation  

## 2018-08-20 NOTE — Anesthesia Procedure Notes (Signed)
Procedure Name: MAC Date/Time: 08/20/2018 8:55 AM Performed by: Cameron Ali, CRNA Pre-anesthesia Checklist: Patient identified, Emergency Drugs available, Suction available, Timeout performed and Patient being monitored Patient Re-evaluated:Patient Re-evaluated prior to induction Oxygen Delivery Method: Nasal cannula Placement Confirmation: positive ETCO2

## 2018-08-20 NOTE — Op Note (Signed)
OPERATIVE NOTE  Barbara Hoover [id number] 08/20/2018   PREOPERATIVE DIAGNOSIS:  Nuclear sclerotic cataract left eye.  H25.12   POSTOPERATIVE DIAGNOSIS:    Nuclear sclerotic cataract left eye.     PROCEDURE:  Phacoemusification with posterior chamber intraocular lens placement of the left eye   LENS:   Implant Name Type Inv. Item Serial No. Manufacturer Lot No. LRB No. Used  LENS IOL DIOP 18.0 - [id number] Intraocular Lens LENS IOL DIOP 18.0 [id number] AMO  Left 1       PCB00 +18.0   ULTRASOUND TIME: 0 minutes 35 seconds.  CDE 3.87   SURGEON:  Willey Blade, MD, MPH   ANESTHESIA:  Topical with tetracaine drops augmented with 1% preservative-free intracameral lidocaine.  ESTIMATED BLOOD LOSS: <1 mL   COMPLICATIONS:  None.   DESCRIPTION OF PROCEDURE:  The patient was identified in the holding room and transported to the operating room and placed in the supine position under the operating microscope.  The left eye was identified as the operative eye and it was prepped and draped in the usual sterile ophthalmic fashion.   A 1.0 millimeter clear-corneal paracentesis was made at the 5:00 position. 0.5 ml of preservative-free 1% lidocaine with epinephrine was injected into the anterior chamber.  The anterior chamber was filled with Healon 5 viscoelastic.  A 2.4 millimeter keratome was used to make a near-clear corneal incision at the 2:00 position.  A curvilinear capsulorrhexis was made with a cystotome and capsulorrhexis forceps.  Balanced salt solution was used to hydrodissect and hydrodelineate the nucleus.   Phacoemulsification was then used in stop and chop fashion to remove the lens nucleus and epinucleus.  The remaining cortex was then removed using the irrigation and aspiration handpiece. Healon was then placed into the capsular bag to distend it for lens placement.  A lens was then injected into the capsular bag.  The remaining viscoelastic was aspirated.   Wounds were hydrated with  balanced salt solution.  The anterior chamber was inflated to a physiologic pressure with balanced salt solution.  Intracameral vigamox 0.1 mL undiltued was injected into the eye and a drop placed onto the ocular surface.  No wound leaks were noted.  The patient was taken to the recovery room in stable condition without complications of anesthesia or surgery  Willey Blade 08/20/2018, 9:18 AM

## 2018-08-21 ENCOUNTER — Encounter: Payer: Self-pay | Admitting: Ophthalmology

## 2018-09-05 ENCOUNTER — Encounter: Payer: Self-pay | Admitting: *Deleted

## 2018-09-05 ENCOUNTER — Other Ambulatory Visit: Payer: Self-pay

## 2018-09-12 NOTE — Discharge Instructions (Signed)

## 2018-09-17 ENCOUNTER — Ambulatory Visit: Payer: Medicare Other | Admitting: Anesthesiology

## 2018-09-17 ENCOUNTER — Encounter
Admission: RE | Disposition: A | Discharge status: Home / Self Care | Payer: Self-pay | Source: Home / Self Care | Attending: Ophthalmology

## 2018-09-17 ENCOUNTER — Ambulatory Visit
Admission: RE | Admit: 2018-09-17 | Discharge: 2018-09-17 | Disposition: A | Discharge status: Home / Self Care | Payer: Medicare Other | Attending: Ophthalmology | Admitting: Ophthalmology

## 2018-09-17 DIAGNOSIS — I10 Essential (primary) hypertension: Secondary | ICD-10-CM | POA: Insufficient documentation

## 2018-09-17 DIAGNOSIS — Z79899 Other long term (current) drug therapy: Secondary | ICD-10-CM | POA: Diagnosis not present

## 2018-09-17 DIAGNOSIS — Z87891 Personal history of nicotine dependence: Secondary | ICD-10-CM | POA: Insufficient documentation

## 2018-09-17 DIAGNOSIS — M1990 Unspecified osteoarthritis, unspecified site: Secondary | ICD-10-CM | POA: Insufficient documentation

## 2018-09-17 DIAGNOSIS — H2511 Age-related nuclear cataract, right eye: Secondary | ICD-10-CM | POA: Insufficient documentation

## 2018-09-17 DIAGNOSIS — E7800 Pure hypercholesterolemia, unspecified: Secondary | ICD-10-CM | POA: Diagnosis not present

## 2018-09-17 DIAGNOSIS — Z9861 Coronary angioplasty status: Secondary | ICD-10-CM | POA: Insufficient documentation

## 2018-09-17 DIAGNOSIS — Z882 Allergy status to sulfonamides status: Secondary | ICD-10-CM | POA: Insufficient documentation

## 2018-09-17 DIAGNOSIS — G629 Polyneuropathy, unspecified: Secondary | ICD-10-CM | POA: Insufficient documentation

## 2018-09-17 DIAGNOSIS — K219 Gastro-esophageal reflux disease without esophagitis: Secondary | ICD-10-CM | POA: Insufficient documentation

## 2018-09-17 HISTORY — PX: CATARACT EXTRACTION W/PHACO: SHX586

## 2018-09-17 SURGERY — PHACOEMULSIFICATION, CATARACT, WITH IOL INSERTION
Anesthesia: Monitor Anesthesia Care | Site: Eye | Laterality: Right

## 2018-09-17 MED ORDER — SODIUM HYALURONATE 23 MG/ML IO SOLN
INTRAOCULAR | Status: DC | PRN
  Administered 2018-09-17: 0.6 mL via INTRAOCULAR

## 2018-09-17 MED ORDER — SODIUM HYALURONATE 10 MG/ML IO SOLN
INTRAOCULAR | Status: DC | PRN
  Administered 2018-09-17: 0.55 mL via INTRAOCULAR

## 2018-09-17 MED ORDER — ACETAMINOPHEN 160 MG/5ML PO SOLN: 325.0000 mg | Freq: Once | ORAL | Status: DC

## 2018-09-17 MED ORDER — MIDAZOLAM HCL 2 MG/2ML IJ SOLN
INTRAMUSCULAR | Status: DC | PRN
  Administered 2018-09-17: 2 mg via INTRAVENOUS

## 2018-09-17 MED ORDER — LIDOCAINE HCL (PF) 2 % IJ SOLN
INTRAMUSCULAR | Status: DC | PRN
  Administered 2018-09-17: 40 mg via INTRADERMAL

## 2018-09-17 MED ORDER — ARMC OPHTHALMIC DILATING DROPS
1.0000 "application " | OPHTHALMIC | Status: DC | PRN
  Administered 2018-09-17 (×3): 1 via OPHTHALMIC

## 2018-09-17 MED ORDER — TETRACAINE HCL 0.5 % OP SOLN
1.0000 [drp] | OPHTHALMIC | Status: DC | PRN
  Administered 2018-09-17 (×3): 1 [drp] via OPHTHALMIC

## 2018-09-17 MED ORDER — LACTATED RINGERS IV SOLN: INTRAVENOUS | Status: DC

## 2018-09-17 MED ORDER — LIDOCAINE HCL (PF) 2 % IJ SOLN
INTRAOCULAR | Status: DC | PRN
  Administered 2018-09-17: 1 mL via INTRAOCULAR

## 2018-09-17 MED ORDER — MOXIFLOXACIN HCL 0.5 % OP SOLN
OPHTHALMIC | Status: DC | PRN
  Administered 2018-09-17: 0.2 mL via OPHTHALMIC

## 2018-09-17 MED ORDER — EPINEPHRINE PF 1 MG/ML IJ SOLN
INTRAOCULAR | Status: DC | PRN
  Administered 2018-09-17: 61 mL via OPHTHALMIC

## 2018-09-17 MED ORDER — ACETAMINOPHEN 325 MG PO TABS: 325.0000 mg | ORAL_TABLET | Freq: Once | ORAL | Status: DC

## 2018-09-17 MED ORDER — FENTANYL CITRATE (PF) 100 MCG/2ML IJ SOLN
INTRAMUSCULAR | Status: DC | PRN
  Administered 2018-09-17 (×2): 50 ug via INTRAVENOUS

## 2018-09-17 SURGICAL SUPPLY — 19 items
CANNULA ANT/CHMB 27G (MISCELLANEOUS) ×2 IMPLANT
CANNULA ANT/CHMB 27GA (MISCELLANEOUS) ×4 IMPLANT
DISSECTOR HYDRO NUCLEUS 50X22 (MISCELLANEOUS) ×2 IMPLANT
GLOVE SURG LX 7.5 STRW (GLOVE) ×1
GLOVE SURG LX STRL 7.5 STRW (GLOVE) ×1 IMPLANT
GLOVE SURG SYN 8.5  E (GLOVE) ×1
GLOVE SURG SYN 8.5 E (GLOVE) ×1 IMPLANT
GLOVE SURG SYN 8.5 PF PI (GLOVE) ×1 IMPLANT
GOWN STRL REUS W/ TWL LRG LVL3 (GOWN DISPOSABLE) ×2 IMPLANT
GOWN STRL REUS W/TWL LRG LVL3 (GOWN DISPOSABLE) ×2
LENS IOL TECNIS ITEC 19.0 (Intraocular Lens) ×1 IMPLANT
MARKER SKIN DUAL TIP RULER LAB (MISCELLANEOUS) ×2 IMPLANT
PACK DR. KING ARMS (PACKS) ×2 IMPLANT
PACK EYE AFTER SURG (MISCELLANEOUS) ×2 IMPLANT
PACK OPTHALMIC (MISCELLANEOUS) ×2 IMPLANT
SYR 3ML LL SCALE MARK (SYRINGE) ×2 IMPLANT
SYR TB 1ML LUER SLIP (SYRINGE) ×2 IMPLANT
WATER STERILE IRR 500ML POUR (IV SOLUTION) ×2 IMPLANT
WIPE NON LINTING 3.25X3.25 (MISCELLANEOUS) ×2 IMPLANT

## 2018-09-17 NOTE — Op Note (Signed)
OPERATIVE NOTE  Barbara Hoover [id number] 09/17/2018   PREOPERATIVE DIAGNOSIS:  Nuclear sclerotic cataract right eye.  H25.11   POSTOPERATIVE DIAGNOSIS:    Nuclear sclerotic cataract right eye.     PROCEDURE:  Phacoemusification with posterior chamber intraocular lens placement of the right eye   LENS:   Implant Name Type Inv. Item Serial No. Manufacturer Lot No. LRB No. Used  LENS IOL DIOP 19.0 - [id number] Intraocular Lens LENS IOL DIOP 19.0 [id number] AMO  Right 1       PCB00 +19.0   ULTRASOUND TIME: 0 minutes 38 seconds.  CDE 4.59   SURGEON:  Willey Blade, MD, MPH  ANESTHESIOLOGIST: Anesthesiologist: Ranee Gosselin, MD CRNA: Omer Jack, CRNA   ANESTHESIA:  Topical with tetracaine drops augmented with 1% preservative-free intracameral lidocaine.  ESTIMATED BLOOD LOSS: less than 1 mL.   COMPLICATIONS:  None.   DESCRIPTION OF PROCEDURE:  The patient was identified in the holding room and transported to the operating room and placed in the supine position under the operating microscope.  The right eye was identified as the operative eye and it was prepped and draped in the usual sterile ophthalmic fashion.   A 1.0 millimeter clear-corneal paracentesis was made at the 10:30 position. 0.5 ml of preservative-free 1% lidocaine with epinephrine was injected into the anterior chamber.  The anterior chamber was filled with Healon 5 viscoelastic.  A 2.4 millimeter keratome was used to make a near-clear corneal incision at the 8:00 position.  A curvilinear capsulorrhexis was made with a cystotome and capsulorrhexis forceps.  Balanced salt solution was used to hydrodissect and hydrodelineate the nucleus.   Phacoemulsification was then used in stop and chop fashion to remove the lens nucleus and epinucleus.  The remaining cortex was then removed using the irrigation and aspiration handpiece. Healon was then placed into the capsular bag to distend it for lens placement.  A lens was  then injected into the capsular bag.  The remaining viscoelastic was aspirated.   Wounds were hydrated with balanced salt solution.  The anterior chamber was inflated to a physiologic pressure with balanced salt solution.   Intracameral vigamox 0.1 mL undiluted was injected into the eye and a drop placed onto the ocular surface.  No wound leaks were noted.  The patient was taken to the recovery room in stable condition without complications of anesthesia or surgery  Willey Blade 09/17/2018, 8:41 AM

## 2018-09-17 NOTE — H&P (Signed)

## 2018-09-17 NOTE — Anesthesia Postprocedure Evaluation (Signed)
Anesthesia Post Note  Patient: Barbara Hoover  Procedure(s) Performed: CATARACT EXTRACTION PHACO AND INTRAOCULAR LENS PLACEMENT ([place])  RIGHT (Right Eye)  Patient location during evaluation: PACU Anesthesia Type: MAC Level of consciousness: awake and alert and oriented Pain management: satisfactory to patient Vital Signs Assessment: post-procedure vital signs reviewed and stable Respiratory status: spontaneous breathing, nonlabored ventilation and respiratory function stable Cardiovascular status: blood pressure returned to baseline and stable Postop Assessment: Adequate PO intake and No signs of nausea or vomiting Anesthetic complications: no    Raliegh Ip

## 2018-09-17 NOTE — Transfer of Care (Signed)
Immediate Anesthesia Transfer of Care Note  Patient: Barbara Hoover  Procedure(s) Performed: CATARACT EXTRACTION PHACO AND INTRAOCULAR LENS PLACEMENT ([facility])  RIGHT (Right Eye)  Patient Location: PACU  Anesthesia Type: MAC  Level of Consciousness: awake, alert  and patient cooperative  Airway and Oxygen Therapy: Patient Spontanous Breathing and Patient connected to supplemental oxygen  Post-op Assessment: Post-op Vital signs reviewed, Patient's Cardiovascular Status Stable, Respiratory Function Stable, Patent Airway and No signs of Nausea or vomiting  Post-op Vital Signs: Reviewed and stable  Complications: No apparent anesthesia complications

## 2018-09-17 NOTE — Anesthesia Preprocedure Evaluation (Signed)
Anesthesia Evaluation  Patient identified by MRN, date of birth, ID band Patient awake    Reviewed: Allergy & Precautions, H&P , NPO status , Patient's Chart, lab work & pertinent test results, reviewed documented beta blocker date and time   Airway Mallampati: II  TM Distance: >3 FB Neck ROM: full    Dental no notable dental hx.    Pulmonary former smoker,    Pulmonary exam normal breath sounds clear to auscultation       Cardiovascular Exercise Tolerance: Good hypertension, Normal cardiovascular exam Rhythm:regular Rate:Normal     Neuro/Psych negative neurological ROS  negative psych ROS   GI/Hepatic negative GI ROS, Neg liver ROS,   Endo/Other  negative endocrine ROS  Renal/GU negative Renal ROS  negative genitourinary   Musculoskeletal   Abdominal   Peds  Hematology negative hematology ROS (+)   Anesthesia Other Findings   Reproductive/Obstetrics negative OB ROS                             Anesthesia Physical  Anesthesia Plan  ASA: II  Anesthesia Plan: MAC   Post-op Pain Management:    Induction:   PONV Risk Score and Plan: 2 and Treatment may vary due to age or medical condition and Midazolam  Airway Management Planned:   Additional Equipment:   Intra-op Plan:   Post-operative Plan:   Informed Consent: I have reviewed the patients History and Physical, chart, labs and discussed the procedure including the risks, benefits and alternatives for the proposed anesthesia with the patient or authorized representative who has indicated his/her understanding and acceptance.     Dental Advisory Given  Plan Discussed with: CRNA  Anesthesia Plan Comments:         Anesthesia Quick Evaluation

## 2018-09-17 NOTE — Anesthesia Procedure Notes (Signed)
Procedure Name: MAC Performed by: Abe Schools M, CRNA Pre-anesthesia Checklist: Timeout performed, Patient being monitored, Suction available, Emergency Drugs available and Patient identified Patient Re-evaluated:Patient Re-evaluated prior to induction Oxygen Delivery Method: Nasal cannula       

## 2018-09-18 ENCOUNTER — Encounter: Payer: Self-pay | Admitting: Ophthalmology

## 2019-03-03 IMAGING — MR MR LUMBAR SPINE W/O CM
4 of 5 series · 26 of 48 positions shown · non-contrast
Comparison: None.

CLINICAL DATA: Chronic severe back pain when standing.

EXAM:
MRI LUMBAR SPINE WITHOUT CONTRAST
TECHNIQUE: Multiplanar, multisequence MR imaging of the lumbar spine was
performed. No intravenous contrast was administered.

[Series 2: T2 · sagittal · 4.0mm · 0.81mm/px · 6 of 15 slices shown (1 of 2)]
[im 1/15]
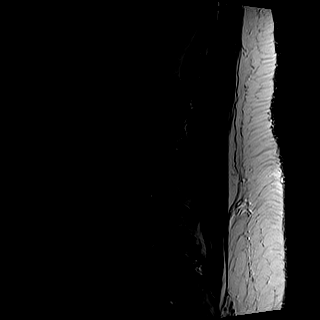
[im 3/15]
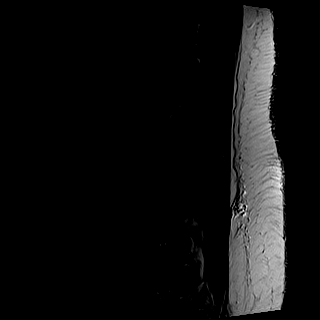
[im 6/15]
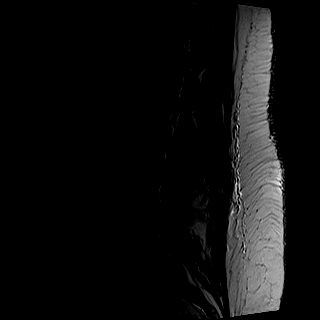
[im 9/15]
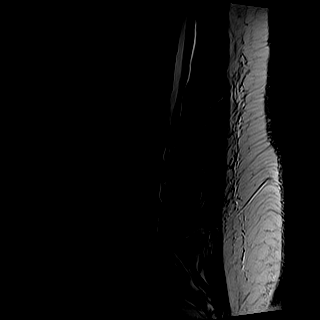
[im 12/15]
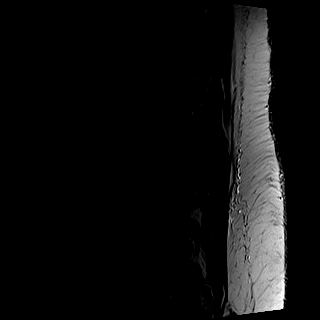
[im 15/15]
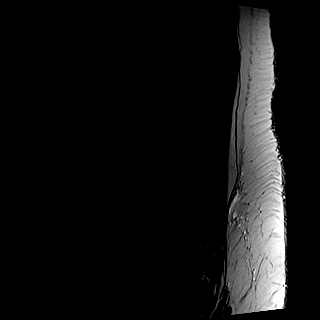

[Series 5: T2 · axial · 4.0mm · 0.78mm/px · z∈[-79,+113]mm · 9 of 35 slices shown (2 of 2)]
[im 1/35]
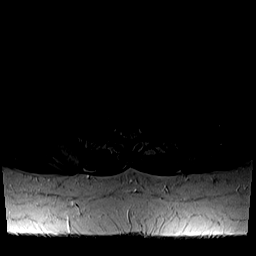
[im 5/35]
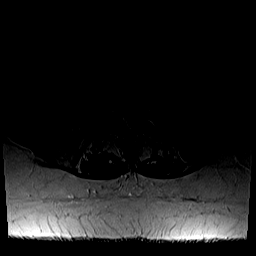
[im 10/35]
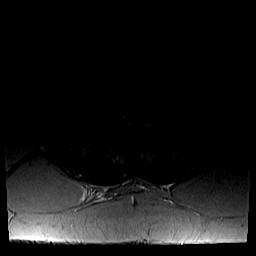
[im 15/35]
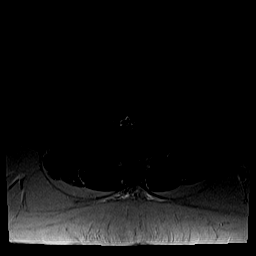
[im 18/35]
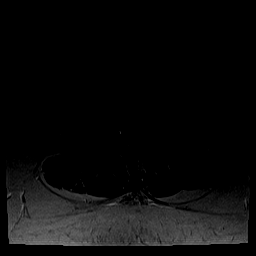
[im 20/35]
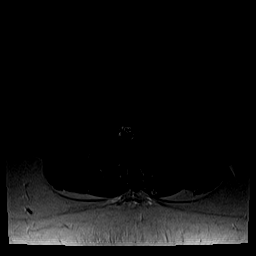
[im 25/35]
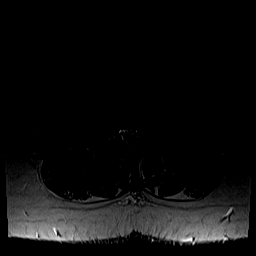
[im 30/35]
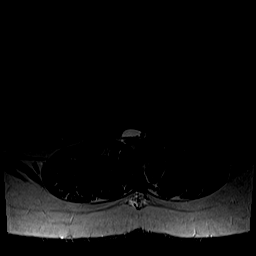
[im 35/35]
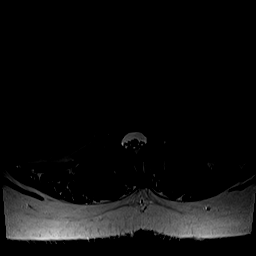

[Series 6: T1 · axial · 4.0mm · 0.39mm/px · z∈[-79,+88]mm · 8 of 35 slices shown (1 of 2)]
[im 1/35]
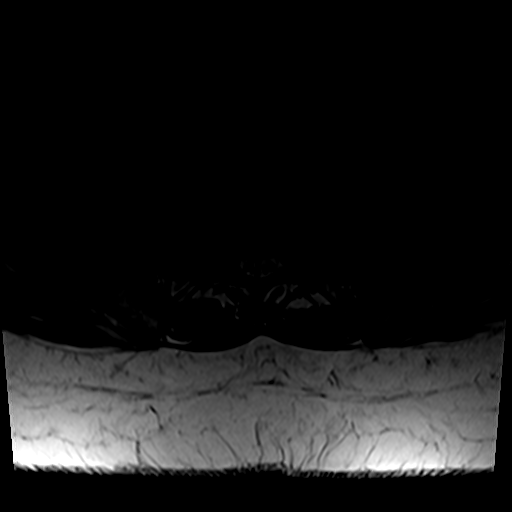
[im 5/35]
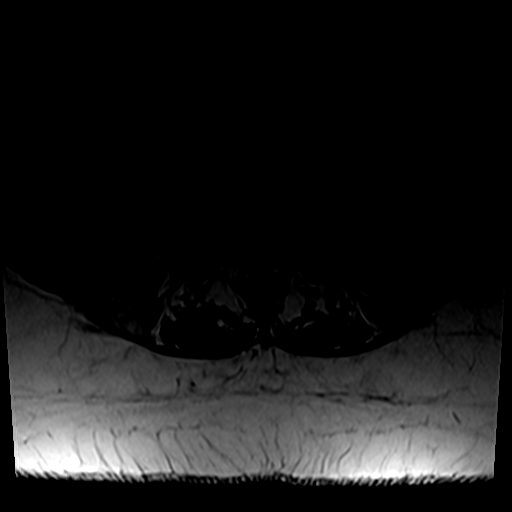
[im 10/35]
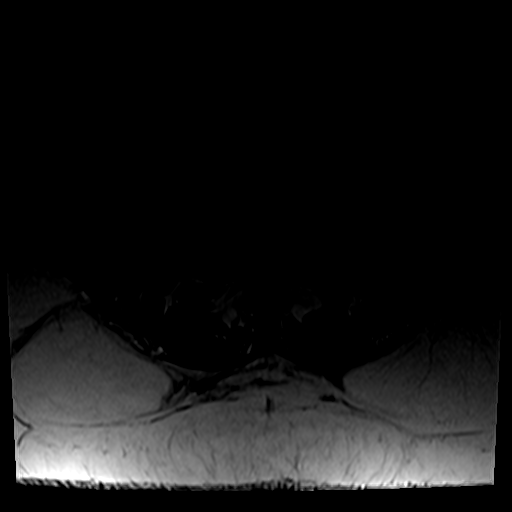
[im 15/35]
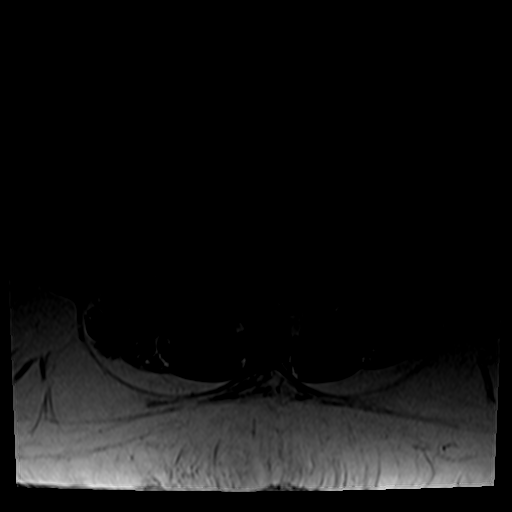
[im 18/35]
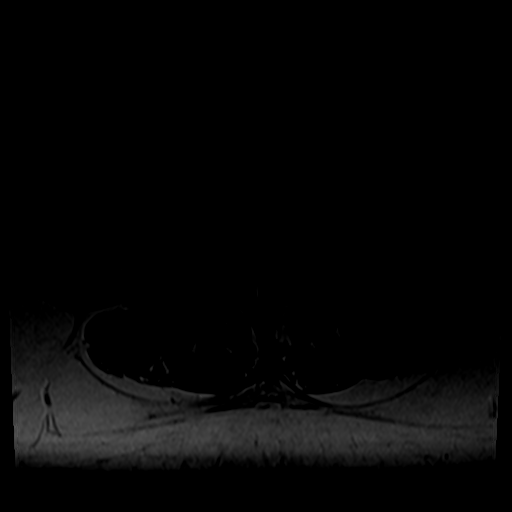
[im 20/35]
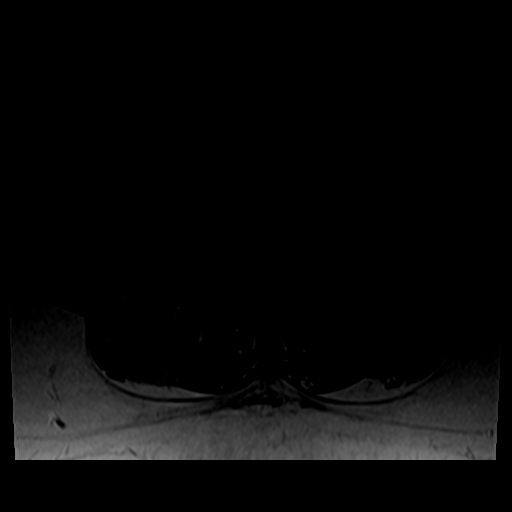
[im 25/35]
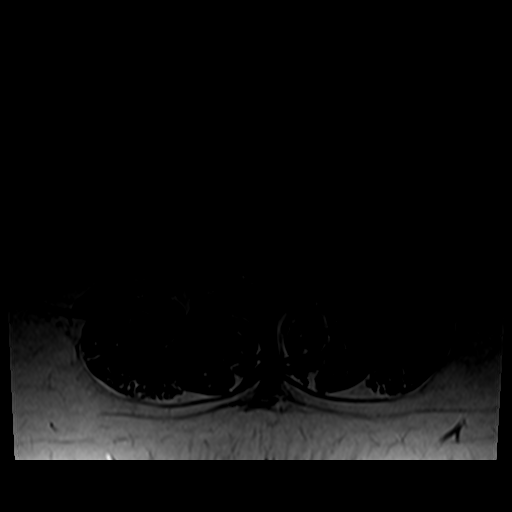
[im 30/35]
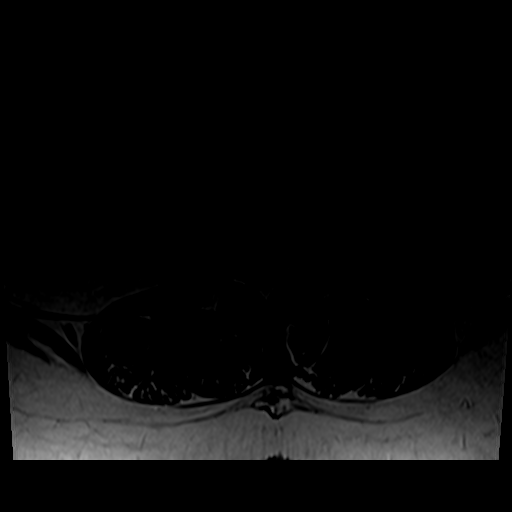

[Series 100: T1 · sagittal · 4.0mm · 0.41mm/px · 3 of 15 slices shown (2 of 2)]
[im 3/15]
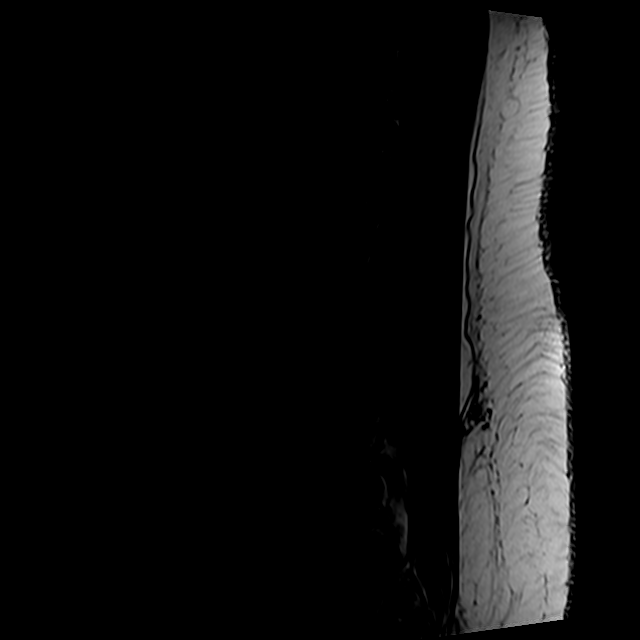
[im 9/15]
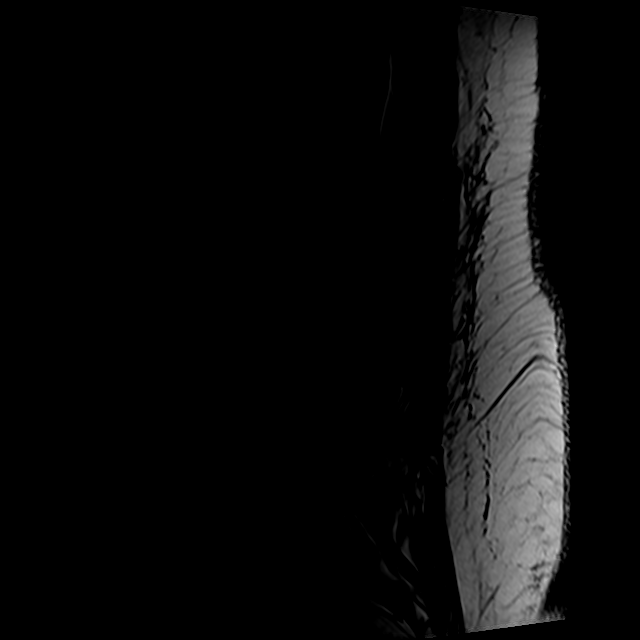
[im 15/15]
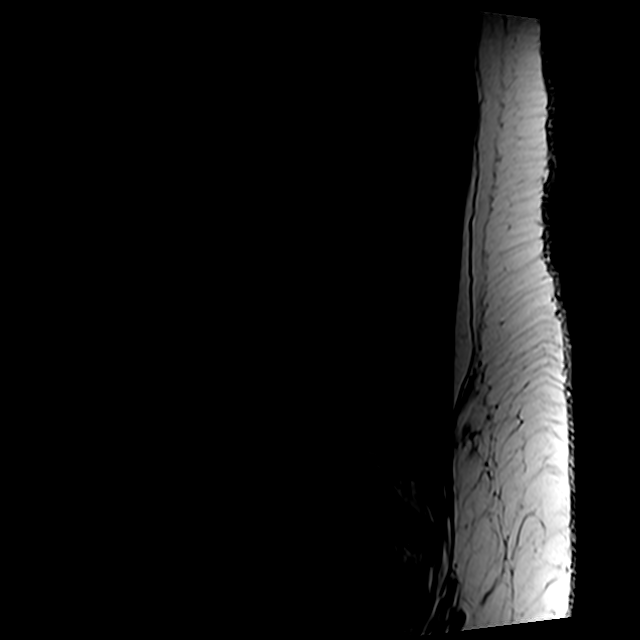

[26 of 48 positions shown; findings below may reference images not displayed]

FINDINGS: Segmentation: Assumed standard. The last well-formed disc space is
designated L5-S1 for the purposes of this report.

Alignment:  Physiologic.

Vertebrae:  No fracture, evidence of discitis, or bone lesion.

Conus medullaris and cauda equina: Conus extends to the L1-L2 level.
Conus and cauda equina appear normal.

Paraspinal and other soft tissues: Negative.

Disc levels:

Prominent epidural lipomatosis posteriorly throughout the lumbar
spinal canal.

T11-T12: Only seen on the sagittal images. Tiny central disc
protrusion. No stenosis.

T12-L1:  Only seen on the sagittal images.  Negative.

L1-L2: Right foraminal and far lateral disc protrusion which mildly
narrows the right neural foramen and contacts the exiting right L1
nerve root. No spinal canal or left neuroforaminal stenosis.

L2-L3: Diffuse disc bulge and mild bilateral facet arthropathy
results in mild-to-moderate central spinal canal stenosis and mild
left neuroforaminal stenosis.

L3-L4: Large left-sided disc protrusion with subarticular,
foraminal, and extraforaminal components. Mild bilateral facet
arthropathy. Moderate central spinal canal stenosis. Severe left
lateral recess narrowing with likely impingement on the descending
left L4 nerve root. No neuroforaminal stenosis.

L4-L5: Diffuse disc bulge with superimposed right foraminal disc
protrusion. Mild bilateral facet arthropathy. Moderate to severe
central spinal canal stenosis. Moderate bilateral lateral recess
narrowing. Mild right neuroforaminal stenosis.

L5-S1: Small right subarticular and foraminal disc protrusion.
Moderate bilateral facet arthropathy with small facet joint
effusions. Prominent epidural fat. Impingement on the descending
right S1 nerve root. Mild right neuroforaminal stenosis.
IMPRESSION: 1. Multilevel degenerative changes of the lumbar spine in
combination with prominent epidural lipomatosis results in moderate
central spinal canal stenosis at L3-L4 and moderate to severe
central spinal canal stenosis at L4-L5.
2. Severe left lateral recess narrowing at L3-L4 due to left-sided
disc protrusion, with likely impingement on the descending left L4
nerve root.
3. Right subarticular and foraminal disc protrusion at L5-S1
impinges on the descending right S1 nerve root.
4. Right foraminal and far lateral disc protrusion at L1-L2 contacts
the exiting right L1 nerve root.

## 2019-08-15 ENCOUNTER — Ambulatory Visit: Payer: Medicare Other

## 2019-08-23 ENCOUNTER — Ambulatory Visit: Payer: Medicare Other | Attending: Internal Medicine

## 2019-08-23 DIAGNOSIS — Z23 Encounter for immunization: Secondary | ICD-10-CM | POA: Insufficient documentation

## 2019-08-23 NOTE — Progress Notes (Signed)
   Covid-19 Vaccination Clinic  Name:  Barbara Hoover    MRN: [id number] DOB: 08-Jan-1946  08/23/2019  [name] was observed post Covid-19 immunization for 15 minutes without incidence. She was provided with Vaccine Information Sheet and instruction to access the V-Safe system.   [name] was instructed to call 911 with any severe reactions post vaccine: Marland Kitchen Difficulty breathing  . Swelling of your face and throat  . A fast heartbeat  . A bad rash all over your body  . Dizziness and weakness    Immunizations Administered    Name Date Dose VIS Date Route   Pfizer COVID-19 Vaccine 08/23/2019 11:02 AM 0.3 mL 06/28/2019 Intramuscular   Manufacturer: [employer], [employer]   Lot: [id number]   NDC: [address]-5

## 2019-09-05 ENCOUNTER — Ambulatory Visit: Payer: Medicare Other

## 2019-09-17 ENCOUNTER — Ambulatory Visit: Payer: Medicare Other | Attending: Internal Medicine

## 2019-09-17 DIAGNOSIS — Z23 Encounter for immunization: Secondary | ICD-10-CM | POA: Insufficient documentation

## 2019-09-17 NOTE — Progress Notes (Signed)
   Covid-19 Vaccination Clinic  Name:  TOBY AYAD    MRN: [id number] DOB: 02-18-1946  09/17/2019  Ms. Rocque was observed post Covid-19 immunization for 15 minutes without incident. She was provided with Vaccine Information Sheet and instruction to access the V-Safe system.   Ms. Wanamaker was instructed to call 911 with any severe reactions post vaccine: Marland Kitchen Difficulty breathing  . Swelling of face and throat  . A fast heartbeat  . A bad rash all over body  . Dizziness and weakness   Immunizations Administered    Name Date Dose VIS Date Route   Pfizer COVID-19 Vaccine 09/17/2019 11:41 AM 0.3 mL 06/28/2019 Intramuscular   Manufacturer: [employer], [employer]   Lot: [id number]   NDC: [address]-0

## 2021-05-20 ENCOUNTER — Other Ambulatory Visit: Payer: Self-pay | Admitting: Family Medicine

## 2021-05-20 DIAGNOSIS — R748 Abnormal levels of other serum enzymes: Secondary | ICD-10-CM

## 2021-05-31 ENCOUNTER — Other Ambulatory Visit: Payer: Medicare Other

## 2021-06-03 ENCOUNTER — Ambulatory Visit
Admission: RE | Admit: 2021-06-03 | Discharge: 2021-06-03 | Disposition: A | Discharge status: Home / Self Care | Payer: Medicare Other | Source: Ambulatory Visit | Attending: Family Medicine | Admitting: Family Medicine

## 2021-06-03 ENCOUNTER — Other Ambulatory Visit: Payer: Self-pay

## 2021-06-03 DIAGNOSIS — R748 Abnormal levels of other serum enzymes: Secondary | ICD-10-CM | POA: Insufficient documentation

## 2022-01-14 ENCOUNTER — Other Ambulatory Visit: Payer: Self-pay | Admitting: Family Medicine

## 2022-01-14 DIAGNOSIS — Z1231 Encounter for screening mammogram for malignant neoplasm of breast: Secondary | ICD-10-CM

## 2022-02-07 ENCOUNTER — Ambulatory Visit
Admission: RE | Admit: 2022-02-07 | Discharge: 2022-02-07 | Disposition: A | Discharge status: Home / Self Care | Payer: Medicare Other | Source: Ambulatory Visit | Attending: Family Medicine | Admitting: Family Medicine

## 2022-02-07 DIAGNOSIS — Z1231 Encounter for screening mammogram for malignant neoplasm of breast: Secondary | ICD-10-CM | POA: Diagnosis present

## 2022-02-09 ENCOUNTER — Inpatient Hospital Stay
Admission: RE | Admit: 2022-02-09 | Discharge: 2022-02-09 | Disposition: A | Discharge status: Home / Self Care | Payer: Self-pay | Source: Ambulatory Visit | Attending: *Deleted | Admitting: *Deleted

## 2022-02-09 ENCOUNTER — Other Ambulatory Visit: Payer: Self-pay | Admitting: Family Medicine

## 2022-02-09 ENCOUNTER — Other Ambulatory Visit: Payer: Self-pay | Admitting: *Deleted

## 2022-02-09 DIAGNOSIS — Z1231 Encounter for screening mammogram for malignant neoplasm of breast: Secondary | ICD-10-CM

## 2023-01-20 ENCOUNTER — Other Ambulatory Visit: Payer: Self-pay | Admitting: Family Medicine

## 2023-01-20 DIAGNOSIS — Z1231 Encounter for screening mammogram for malignant neoplasm of breast: Secondary | ICD-10-CM

## 2023-02-09 ENCOUNTER — Ambulatory Visit
Admission: RE | Admit: 2023-02-09 | Discharge: 2023-02-09 | Disposition: A | Discharge status: Home / Self Care | Payer: Medicare Other | Source: Ambulatory Visit | Attending: Family Medicine | Admitting: Family Medicine

## 2023-02-09 DIAGNOSIS — Z1231 Encounter for screening mammogram for malignant neoplasm of breast: Secondary | ICD-10-CM | POA: Insufficient documentation

## 2024-03-11 ENCOUNTER — Other Ambulatory Visit: Payer: Self-pay | Admitting: Family Medicine

## 2024-03-11 DIAGNOSIS — Z1231 Encounter for screening mammogram for malignant neoplasm of breast: Secondary | ICD-10-CM

## 2024-04-02 ENCOUNTER — Ambulatory Visit
Admission: RE | Admit: 2024-04-02 | Discharge: 2024-04-02 | Disposition: A | Discharge status: Home / Self Care | Source: Ambulatory Visit | Attending: Family Medicine | Admitting: Family Medicine

## 2024-04-02 DIAGNOSIS — Z1231 Encounter for screening mammogram for malignant neoplasm of breast: Secondary | ICD-10-CM | POA: Diagnosis present

## 2024-05-31 ENCOUNTER — Other Ambulatory Visit: Payer: Self-pay | Admitting: Family Medicine

## 2024-05-31 DIAGNOSIS — M5416 Radiculopathy, lumbar region: Secondary | ICD-10-CM

## 2024-06-04 ENCOUNTER — Other Ambulatory Visit: Payer: Self-pay | Admitting: Specialist

## 2024-06-04 DIAGNOSIS — R0609 Other forms of dyspnea: Secondary | ICD-10-CM

## 2024-06-04 DIAGNOSIS — J986 Disorders of diaphragm: Secondary | ICD-10-CM

## 2024-06-06 ENCOUNTER — Ambulatory Visit
Admission: RE | Admit: 2024-06-06 | Discharge: 2024-06-06 | Disposition: A | Discharge status: Home / Self Care | Source: Ambulatory Visit | Attending: Specialist | Admitting: Specialist

## 2024-06-06 DIAGNOSIS — J986 Disorders of diaphragm: Secondary | ICD-10-CM | POA: Insufficient documentation

## 2024-06-06 DIAGNOSIS — R0609 Other forms of dyspnea: Secondary | ICD-10-CM | POA: Insufficient documentation

## 2024-06-08 ENCOUNTER — Ambulatory Visit
Admission: RE | Admit: 2024-06-08 | Discharge: 2024-06-08 | Disposition: A | Discharge status: Home / Self Care | Source: Ambulatory Visit | Attending: Family Medicine | Admitting: Family Medicine

## 2024-06-08 DIAGNOSIS — M5416 Radiculopathy, lumbar region: Secondary | ICD-10-CM

## 2024-07-09 ENCOUNTER — Other Ambulatory Visit: Payer: Self-pay | Admitting: Nurse Practitioner

## 2024-07-09 DIAGNOSIS — I10 Essential (primary) hypertension: Secondary | ICD-10-CM

## 2024-07-09 DIAGNOSIS — E119 Type 2 diabetes mellitus without complications: Secondary | ICD-10-CM

## 2024-07-09 DIAGNOSIS — I480 Paroxysmal atrial fibrillation: Secondary | ICD-10-CM

## 2024-07-09 DIAGNOSIS — R600 Localized edema: Secondary | ICD-10-CM

## 2024-07-09 DIAGNOSIS — I5032 Chronic diastolic (congestive) heart failure: Secondary | ICD-10-CM

## 2024-07-09 DIAGNOSIS — E782 Mixed hyperlipidemia: Secondary | ICD-10-CM

## 2024-07-09 DIAGNOSIS — R0609 Other forms of dyspnea: Secondary | ICD-10-CM

## 2024-07-09 DIAGNOSIS — R079 Chest pain, unspecified: Secondary | ICD-10-CM

## 2024-07-26 NOTE — Progress Notes (Signed)
 [facility] - Physiatry  PROCEDURE: 1. Bilateral L5-S1 transforaminal epidural injections under fluoroscopic guidance. 2. Use of fluoroscopic guidance was required to ensure adequate delivery of medication into the epidural space and for proper needle placement. 3. The patient was monitored with continuous pulse oximetry throughout the procedures.   DIAGNOSIS/INDICATION FOR PROCEDURE: The patient is a pleasant 79 year old female followed by [facility] (and years ago by Dr. 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  Her symptoms are worsened with standing and walking and improved with sitting.  Clinically she has symptoms consistent with facet mediated pain.  An element of neurogenic claudication cannot be ruled out.     MRI from [facility] dated 09/14/17 both images and report were reviewed today.  At L5-S1 there is mild disc desiccation and moderate facet arthropathy.  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  There is severe central stenosis or related to the epidural fat.  There is no foraminal stenosis.  At L4-5 there is mild disc desiccation with a moderate right foraminal disc herniation.  There are moderate facet degenerative changes.  There is mild to moderate central stenosis.  There is mild to moderate right foraminal stenosis.  There is no left foraminal stenosis.  At L3-4 there is mild disc desiccation and broad-based disc bulging.  There is a superimposed left paracentral and foraminal disc herniation.  There is severe left lateral recess stenosis and moderate to severe central stenosis.  There is moderate left foraminal stenosis.  There is no right foraminal stenosis.  At L2-3 there is mild disc desiccation and broad-based disc bulging.  There is moderate central stenosis.  Her pain which is moderate to severe intensity.  She has been sent for facet joint injection.   She has diabetes mellitus.  Hemoglobin A1c on 01/15/2024 was 6.4.   Her pain is rated  8/10 with walking.   Procedures: 07/26/2024: Bilateral L5-S1 transforaminal ESI 04/19/2024: RFA to the bilateral L4-5 and L5-S1 facet joints (no relief)  04/01/2024: MBB to the bilateral L4-5 and L5-S1 facet joints (9/10 to 1/10) 03/14/2024: MBB to the bilateral L4-5 and L5-S1 facet joints (9/10 to 0/10) 02/06/2024: Bilateral L4-5 and L5-S1 facet joint injections (3 days of good relief)  03/20/2018: Bilateral L4-5 and L5-S1 facet joint injections (100% relief) 09/21/2017: Bilateral L4-5 and L5-S1 facet joint injections (good relief)   IMPRESSION/RESULTS: Patient tolerated the procedure well without any complications. We used a 5 inch needle (nearly to the hub) and 2% lidocaine .  Immediately after the procedure she reported 50% relief.  She will follow up with [facility] in 3-4 weeks.     INFORMED CONSENT: The patient understood the potential risks and benefits of the procedure, which were explained to the patient prior to the procedure.  The patient read and signed the consent stating complete understanding of the information, and wished to proceed with the procedure, there were no barriers to understanding.  Ample time was given for any questions to be answered prior to the procedure. The risks of the procedure were explained including, but not limited to the risk of bleeding and/or infection into the spinal area, intervertebral discs, or epidural space; nerve injury; nerve irritation; reaction to medications; etc.  The patient denied any history of bleeding disorders, allergies to medications used or other medical contraindications to the procedure.  No promises were given to any expected outcome.     PROCEDURE: A time out was performed by physician and assistant.  Correct patient, procedure, plan of care, site,  position, and equipment were verified.  Identical procedural technique was used for both epidural injections.  The patient was sterilely prepped with a triple scrub of betadine solution  and draped in the prone position.  Careful attention was paid to aseptic technique throughout the procedure.  Then, the Right L5-S1 neuroforamen was identified under fluoroscopic guidance.  The overlying skin and subcutaneous tissues were anesthetized with approximately 2 cc of 1% Xylocaine .  A 25 gauge spinal needle was inserted down into the foramen.  Approximately 2 cc of Omnipaque-240 mgI/mL contrast was infiltrated under real time-fluoroscopy demonstrating satisfactory spread along the exiting spinal nerve and into the epidural space without vascular uptake.  No aspirate was noted.  Spot films were taken.  A solution containing 0.5 cc of dexamethasone sodium phosphate 10 mg/cc and 0.5 cc of 2% preservative-free lidocaine  was slowly infiltrated around the spinal nerve and into the epidural space under real-time fluoroscopy.  There was good contrast washout.  The needle was removed.   Then, the Left L5-S1 neuroforamen was identified under fluoroscopic guidance.  The overlying skin and subcutaneous tissues were anesthetized with approximately 2 cc of 1% Xylocaine .  A 25 gauge spinal needle was inserted down into the foramen.  Approximately 2 cc of Omnipaque-240 mgI/mL contrast was infiltrated under real time-fluoroscopy demonstrating satisfactory spread along the exiting spinal nerve and into the epidural space without vascular uptake.  No aspirate was noted.  Spot films were taken.  A solution containing 0.5 cc of dexamethasone sodium phosphate 10 mg/cc and 0.5 cc of 2% preservative-free lidocaine  was slowly infiltrated around the spinal nerve and into the epidural space under real-time fluoroscopy.  There was good contrast washout.  The needle was removed.  There were no complications.   DISCHARGE SUMMARY: The patient was monitored post-injection for 30 minutes in the recovery area and remained stable without evidence of complications.  The patient was discharged with discharge instructions in stable  condition. Patient was instructed to contact us  with any problems.  Procedure fluoro time: 28 seconds. Fluoroscopy dose: 15.19 mGy.  This is below the dose threshold #1 (5000 mGy).    BENJAMIN CHARLES CHASNIS, DO

## 2024-07-31 ENCOUNTER — Other Ambulatory Visit: Payer: Self-pay | Admitting: Cardiology

## 2024-07-31 ENCOUNTER — Encounter (HOSPITAL_COMMUNITY): Payer: Self-pay

## 2024-07-31 DIAGNOSIS — R079 Chest pain, unspecified: Secondary | ICD-10-CM

## 2024-07-31 NOTE — Progress Notes (Signed)
 Orders only for Cardiac PET stress test consent.   Hamp Levine, PA-C

## 2024-08-01 ENCOUNTER — Ambulatory Visit
Admission: RE | Admit: 2024-08-01 | Discharge: 2024-08-01 | Disposition: A | Discharge status: Home / Self Care | Source: Ambulatory Visit | Attending: Nurse Practitioner | Admitting: Nurse Practitioner

## 2024-08-01 DIAGNOSIS — G4733 Obstructive sleep apnea (adult) (pediatric): Secondary | ICD-10-CM | POA: Diagnosis not present

## 2024-08-01 DIAGNOSIS — R079 Chest pain, unspecified: Secondary | ICD-10-CM | POA: Diagnosis not present

## 2024-08-01 DIAGNOSIS — E119 Type 2 diabetes mellitus without complications: Secondary | ICD-10-CM | POA: Diagnosis not present

## 2024-08-01 DIAGNOSIS — R600 Localized edema: Secondary | ICD-10-CM | POA: Diagnosis not present

## 2024-08-01 DIAGNOSIS — I10 Essential (primary) hypertension: Secondary | ICD-10-CM | POA: Diagnosis not present

## 2024-08-01 DIAGNOSIS — I5032 Chronic diastolic (congestive) heart failure: Secondary | ICD-10-CM | POA: Insufficient documentation

## 2024-08-01 DIAGNOSIS — E782 Mixed hyperlipidemia: Secondary | ICD-10-CM | POA: Diagnosis not present

## 2024-08-01 DIAGNOSIS — I480 Paroxysmal atrial fibrillation: Secondary | ICD-10-CM | POA: Insufficient documentation

## 2024-08-01 DIAGNOSIS — R918 Other nonspecific abnormal finding of lung field: Secondary | ICD-10-CM | POA: Diagnosis present

## 2024-08-01 DIAGNOSIS — R0609 Other forms of dyspnea: Secondary | ICD-10-CM

## 2024-08-01 DIAGNOSIS — K760 Fatty (change of) liver, not elsewhere classified: Secondary | ICD-10-CM | POA: Diagnosis not present

## 2024-08-01 DIAGNOSIS — I700 Atherosclerosis of aorta: Secondary | ICD-10-CM | POA: Insufficient documentation

## 2024-08-01 MED ORDER — REGADENOSON 0.4 MG/5ML IV SOLN
INTRAVENOUS | Status: AC
  Filled 2024-08-01: qty 5

## 2024-08-01 MED ORDER — RUBIDIUM RB82 GENERATOR (RUBYFILL)
25.0000 | PACK | Freq: Once | INTRAVENOUS | Status: AC
  Administered 2024-08-01: 24.34 via INTRAVENOUS

## 2024-08-01 MED ORDER — REGADENOSON 0.4 MG/5ML IV SOLN
0.4000 mg | Freq: Once | INTRAVENOUS | Status: AC
  Administered 2024-08-01: 0.4 mg via INTRAVENOUS
  Filled 2024-08-01: qty 5

## 2024-08-01 MED ORDER — RUBIDIUM RB82 GENERATOR (RUBYFILL)
25.0000 | PACK | Freq: Once | INTRAVENOUS | Status: AC
  Administered 2024-08-01: 24.26 via INTRAVENOUS

## 2024-08-01 NOTE — Progress Notes (Signed)
 Patient presents for a cardiac PET stress test and tolerated procedure without incident. She did report feeling warm for about a minute. Patient maintained acceptable vital signs throughout the test and was offered caffeine after test.  Patient escorted out of department in a wheelchair.

## 2024-08-05 LAB — NM PET CT CARDIAC PERFUSION MULTI W/ABSOLUTE BLOODFLOW
MBFR: 1.5
Nuc Rest EF: 60 %
Nuc Stress EF: 60 %
Peak HR: 82 {beats}/min
Rest HR: 77 {beats}/min
Rest MBF: 1.2 ml/g/min
Rest Nuclear Isotope Dose: 24.3 mCi
SRS: 0
SSS: 3
ST Depression (mm): 0 mm
Stress MBF: 1.8 ml/g/min
Stress Nuclear Isotope Dose: 24.3 mCi
TID: 1.1
